# Patient Record
Sex: Female | Born: 1970 | Race: White | Hispanic: No | Marital: Married | Smoking: Never smoker
Health system: Southern US, Community
[De-identification: ages and names within clinical notes are randomized; demographics above are authoritative.]

## PROBLEM LIST (undated history)

## (undated) ENCOUNTER — Ambulatory Visit: Discharge status: Absent without leave | Payer: 59

## (undated) ENCOUNTER — Ambulatory Visit: Admission: EM | Payer: 59 | Source: Home / Self Care

## (undated) DIAGNOSIS — R112 Nausea with vomiting, unspecified: Secondary | ICD-10-CM

## (undated) DIAGNOSIS — T8859XA Other complications of anesthesia, initial encounter: Secondary | ICD-10-CM

## (undated) DIAGNOSIS — I878 Other specified disorders of veins: Secondary | ICD-10-CM

## (undated) DIAGNOSIS — M79661 Pain in right lower leg: Secondary | ICD-10-CM

## (undated) DIAGNOSIS — T4145XA Adverse effect of unspecified anesthetic, initial encounter: Secondary | ICD-10-CM

## (undated) DIAGNOSIS — IMO0001 Reserved for inherently not codable concepts without codable children: Secondary | ICD-10-CM

## (undated) DIAGNOSIS — M25561 Pain in right knee: Secondary | ICD-10-CM

## (undated) DIAGNOSIS — K219 Gastro-esophageal reflux disease without esophagitis: Secondary | ICD-10-CM

## (undated) DIAGNOSIS — W19XXXA Unspecified fall, initial encounter: Secondary | ICD-10-CM

## (undated) DIAGNOSIS — K589 Irritable bowel syndrome without diarrhea: Secondary | ICD-10-CM

## (undated) DIAGNOSIS — Z9889 Other specified postprocedural states: Secondary | ICD-10-CM

## (undated) DIAGNOSIS — I10 Essential (primary) hypertension: Secondary | ICD-10-CM

## (undated) DIAGNOSIS — M7989 Other specified soft tissue disorders: Secondary | ICD-10-CM

## (undated) DIAGNOSIS — E785 Hyperlipidemia, unspecified: Secondary | ICD-10-CM

## (undated) DIAGNOSIS — K529 Noninfective gastroenteritis and colitis, unspecified: Secondary | ICD-10-CM

## (undated) DIAGNOSIS — I499 Cardiac arrhythmia, unspecified: Secondary | ICD-10-CM

## (undated) HISTORY — DX: Essential (primary) hypertension: I10

## (undated) HISTORY — DX: Gastro-esophageal reflux disease without esophagitis: K21.9

## (undated) HISTORY — PX: ABDOMINAL HYSTERECTOMY: SHX81

## (undated) HISTORY — DX: Other specified disorders of veins: I87.8

## (undated) HISTORY — DX: Hyperlipidemia, unspecified: E78.5

## (undated) HISTORY — PX: DIAGNOSTIC LAPAROSCOPY: SUR761

## (undated) HISTORY — DX: Irritable bowel syndrome, unspecified: K58.9

## (undated) HISTORY — PX: FEMUR FRACTURE SURGERY: SHX633

## (undated) HISTORY — PX: DILATION AND CURETTAGE OF UTERUS: SHX78

## (undated) HISTORY — PX: COLON SURGERY: SHX602

## (undated) HISTORY — PX: APPENDECTOMY: SHX54

## (undated) HISTORY — PX: OTHER SURGICAL HISTORY: SHX169

## (undated) HISTORY — DX: Noninfective gastroenteritis and colitis, unspecified: K52.9

## (undated) HISTORY — PX: CHOLECYSTECTOMY: SHX55

## (undated) HISTORY — DX: Reserved for inherently not codable concepts without codable children: IMO0001

---

## 2010-12-24 ENCOUNTER — Other Ambulatory Visit: Payer: Self-pay | Admitting: Obstetrics and Gynecology

## 2010-12-24 DIAGNOSIS — Z1231 Encounter for screening mammogram for malignant neoplasm of breast: Secondary | ICD-10-CM

## 2011-01-10 ENCOUNTER — Ambulatory Visit: Payer: Self-pay

## 2011-01-31 ENCOUNTER — Other Ambulatory Visit: Payer: Self-pay | Admitting: Obstetrics and Gynecology

## 2011-01-31 ENCOUNTER — Ambulatory Visit
Admission: RE | Admit: 2011-01-31 | Discharge: 2011-01-31 | Disposition: A | Discharge status: Home / Self Care | Payer: BC Managed Care – PPO | Source: Ambulatory Visit | Attending: Obstetrics and Gynecology | Admitting: Obstetrics and Gynecology

## 2011-01-31 DIAGNOSIS — Z1231 Encounter for screening mammogram for malignant neoplasm of breast: Secondary | ICD-10-CM

## 2011-01-31 DIAGNOSIS — R928 Other abnormal and inconclusive findings on diagnostic imaging of breast: Secondary | ICD-10-CM

## 2011-02-05 ENCOUNTER — Ambulatory Visit
Admission: RE | Admit: 2011-02-05 | Discharge: 2011-02-05 | Disposition: A | Discharge status: Home / Self Care | Payer: BC Managed Care – PPO | Source: Ambulatory Visit | Attending: Obstetrics and Gynecology | Admitting: Obstetrics and Gynecology

## 2011-02-05 DIAGNOSIS — R928 Other abnormal and inconclusive findings on diagnostic imaging of breast: Secondary | ICD-10-CM

## 2012-03-02 ENCOUNTER — Other Ambulatory Visit: Payer: Self-pay | Admitting: Obstetrics and Gynecology

## 2012-03-02 DIAGNOSIS — Z1231 Encounter for screening mammogram for malignant neoplasm of breast: Secondary | ICD-10-CM

## 2012-03-06 ENCOUNTER — Ambulatory Visit
Admission: RE | Admit: 2012-03-06 | Discharge: 2012-03-06 | Disposition: A | Discharge status: Home / Self Care | Payer: BC Managed Care – PPO | Source: Ambulatory Visit | Attending: Obstetrics and Gynecology | Admitting: Obstetrics and Gynecology

## 2012-03-06 DIAGNOSIS — Z1231 Encounter for screening mammogram for malignant neoplasm of breast: Secondary | ICD-10-CM

## 2013-02-22 ENCOUNTER — Other Ambulatory Visit: Payer: Self-pay

## 2013-02-22 DIAGNOSIS — Z1231 Encounter for screening mammogram for malignant neoplasm of breast: Secondary | ICD-10-CM

## 2013-03-15 ENCOUNTER — Encounter: Payer: Self-pay | Admitting: Family Medicine

## 2013-03-15 ENCOUNTER — Ambulatory Visit (INDEPENDENT_AMBULATORY_CARE_PROVIDER_SITE_OTHER): Payer: BC Managed Care – PPO | Admitting: Family Medicine

## 2013-03-15 VITALS — BP 152/104 | HR 70 | Wt 234.0 lb

## 2013-03-15 DIAGNOSIS — M545 Low back pain, unspecified: Secondary | ICD-10-CM

## 2013-03-15 DIAGNOSIS — M549 Dorsalgia, unspecified: Secondary | ICD-10-CM

## 2013-03-15 DIAGNOSIS — I10 Essential (primary) hypertension: Secondary | ICD-10-CM

## 2013-03-15 LAB — POCT URINALYSIS DIPSTICK: Spec Grav, UA: 1.015

## 2013-03-15 MED ORDER — ETODOLAC 400 MG PO TABS: 400.0000 mg | ORAL_TABLET | Freq: Two times a day (BID) | ORAL | Status: DC

## 2013-03-15 MED ORDER — CHLORZOXAZONE 500 MG PO TABS: 500.0000 mg | ORAL_TABLET | Freq: Three times a day (TID) | ORAL | Status: DC | PRN

## 2013-03-15 NOTE — Progress Notes (Signed)
  Subjective:    Patient ID: Stacy Russell, female    DOB: 1971-10-14, 43 y.o.   MRN: [id number]  Back Pain Chronicity: sharp shooting pain three weeks ago. worse when driving car.bad pain sharp. then left, and wentaway. tpain left knee one week ago. The current episode started in the past 7 days. The problem occurs constantly. The problem has been gradually worsening since onset. The pain is present in the lumbar spine. The quality of the pain is described as aching. The pain does not radiate. The pain is at a severity of 6/10. The pain is moderate. The pain is worse during the day. The symptoms are aggravated by bending. Stiffness is present in the morning (arms and hands). She has tried NSAIDs and ice (ice and heat. helped some. considered chiropractor.) for the symptoms. The treatment provided mild relief.   Patient also claims compliance with her hypertension medicine. Not exercising as much as she would hope. Mostly watching her salt intake. No major shortness of breath.  Several weeks ago patient had leg pain. Deep in leg. Worse with motions and with driving. She recalls no injury. No acute chest pain or shortness of breath.  No obvious urinary symptoms Review of Systems  Musculoskeletal: Positive for back pain.   no bowel pain no weight loss or weight gain. No fever no chills decent appetite no urinary symptoms ROS otherwise negative     Objective:   Physical Exam  Alert mild discomfort. Vitals reviewed. Blood pressure 134/86 on repeat. Lungs clear. Heart regular rate and rhythm. Spine nontender. Positive paraspinal tenderness. Primarily lumbar region. No true CVA tenderness. Negative straight leg raise. Right leg pulses good sensation good good range of motion no joint inflammation.  UA negative.    Assessment & Plan:  Impression #1 probable lumbar strain discussed. #2 transient right leg pain clinically improved. Etiology unclear. Discussed. #3 hypertension good control discussed. #4  reflux no longer requires medication. Plan maintain same blood pressure medicine. Diet exercise discussed in encourage. Chronic back exercises encouraged. Lodine twice a day with food and chlorzoxazone 3 times a day rationale discussed. 25 minutes spent most in discussion WSL

## 2013-03-15 NOTE — Patient Instructions (Signed)
Back Exercises Back exercises help treat and prevent back injuries. The goal of back exercises is to increase the strength of your abdominal and back muscles and the flexibility of your back. These exercises should be started when you no longer have back pain. Back exercises include:  Pelvic Tilt. Lie on your back with your knees bent. Tilt your pelvis until the lower part of your back is against the floor. Hold this position 5 to 10 sec and repeat 5 to 10 times.  Knee to Chest. Pull first 1 knee up against your chest and hold for 20 to 30 seconds, repeat this with the other knee, and then both knees. This may be done with the other leg straight or bent, whichever feels better.  Sit-Ups or Curl-Ups. Bend your knees 90 degrees. Start with tilting your pelvis, and do a partial, slow sit-up, lifting your trunk only 30 to 45 degrees off the floor. Take at least 2 to 3 seconds for each sit-up. Do not do sit-ups with your knees out straight. If partial sit-ups are difficult, simply do the above but with only tightening your abdominal muscles and holding it as directed.  Hip-Lift. Lie on your back with your knees flexed 90 degrees. Push down with your feet and shoulders as you raise your hips a couple inches off the floor; hold for 10 seconds, repeat 5 to 10 times.  Back arches. Lie on your stomach, propping yourself up on bent elbows. Slowly press on your hands, causing an arch in your low back. Repeat 3 to 5 times. Any initial stiffness and discomfort should lessen with repetition over time.  Shoulder-Lifts. Lie face down with arms beside your body. Keep hips and torso pressed to floor as you slowly lift your head and shoulders off the floor. Do not overdo your exercises, especially in the beginning. Exercises may cause you some mild back discomfort which lasts for a few minutes; however, if the pain is more severe, or lasts for more than 15 minutes, do not continue exercises until you see your caregiver.  Improvement with exercise therapy for back problems is slow.  See your caregivers for assistance with developing a proper back exercise program. Document Released: 11/07/2004 Document Revised: 12/23/2011 Document Reviewed: 08/01/2011 [employer] Patient Information 2014 [employer].  

## 2013-03-18 DIAGNOSIS — I10 Essential (primary) hypertension: Secondary | ICD-10-CM | POA: Insufficient documentation

## 2013-03-18 DIAGNOSIS — M549 Dorsalgia, unspecified: Secondary | ICD-10-CM | POA: Insufficient documentation

## 2013-03-26 ENCOUNTER — Ambulatory Visit: Payer: BC Managed Care – PPO

## 2013-06-10 ENCOUNTER — Ambulatory Visit
Admission: RE | Admit: 2013-06-10 | Discharge: 2013-06-10 | Disposition: A | Discharge status: Home / Self Care | Payer: BC Managed Care – PPO | Source: Ambulatory Visit

## 2013-06-10 DIAGNOSIS — Z1231 Encounter for screening mammogram for malignant neoplasm of breast: Secondary | ICD-10-CM

## 2013-06-21 ENCOUNTER — Telehealth: Payer: Self-pay | Admitting: Family Medicine

## 2013-06-21 MED ORDER — DICLOFENAC SODIUM 75 MG PO TBEC: 75.0000 mg | DELAYED_RELEASE_TABLET | Freq: Two times a day (BID) | ORAL | Status: DC

## 2013-06-21 NOTE — Telephone Encounter (Signed)
(  Numb 28 one ref)

## 2013-06-21 NOTE — Telephone Encounter (Signed)
RX sent into [facility]. Patient was notified.

## 2013-06-21 NOTE — Telephone Encounter (Signed)
voltaren 75 bid with food

## 2013-06-21 NOTE — Telephone Encounter (Signed)
Patient would like an anti-inflammatory called in for her back pain  [address]

## 2013-06-30 ENCOUNTER — Telehealth: Payer: Self-pay | Admitting: Family Medicine

## 2013-06-30 ENCOUNTER — Encounter: Payer: Self-pay | Admitting: Family Medicine

## 2013-06-30 ENCOUNTER — Ambulatory Visit (INDEPENDENT_AMBULATORY_CARE_PROVIDER_SITE_OTHER): Payer: BC Managed Care – PPO | Admitting: Family Medicine

## 2013-06-30 VITALS — BP 140/98 | Ht 70.0 in | Wt 248.2 lb

## 2013-06-30 DIAGNOSIS — I10 Essential (primary) hypertension: Secondary | ICD-10-CM

## 2013-06-30 DIAGNOSIS — M1990 Unspecified osteoarthritis, unspecified site: Secondary | ICD-10-CM

## 2013-06-30 DIAGNOSIS — M129 Arthropathy, unspecified: Secondary | ICD-10-CM

## 2013-06-30 DIAGNOSIS — Z79899 Other long term (current) drug therapy: Secondary | ICD-10-CM

## 2013-06-30 DIAGNOSIS — R5381 Other malaise: Secondary | ICD-10-CM

## 2013-06-30 MED ORDER — CHLORZOXAZONE 500 MG PO TABS: 500.0000 mg | ORAL_TABLET | Freq: Three times a day (TID) | ORAL | Status: DC | PRN

## 2013-06-30 MED ORDER — ETODOLAC 400 MG PO TABS: 400.0000 mg | ORAL_TABLET | Freq: Two times a day (BID) | ORAL | Status: DC

## 2013-06-30 NOTE — Telephone Encounter (Signed)
etodolac (LODINE) 400 MG tablet is what the patient is wanting for her back not the Voltaren 75 mg that was called in she doesn't ever remember taking voltaren before. Pt has made an appt for this afternoon, advised this med may not be called in before her appt. Pt understands.

## 2013-06-30 NOTE — Progress Notes (Signed)
  Subjective:    Patient ID: Stacy Russell, female    DOB: 1971-03-20, 42 y.o.   MRN: [id number]  Back Pain This is a recurrent problem. The current episode started yesterday. The problem occurs constantly. The problem has been gradually worsening since onset. The pain is present in the lumbar spine. The quality of the pain is described as aching. The pain is at a severity of 9/10. The pain is severe. The pain is the same all the time. Stiffness is present all day. She has tried NSAIDs for the symptoms. The treatment provided no relief.   4 wks ago pain in both hips  exercssing on the t-mill. Three times per wk.  Hurts and aches experiencing most of the time  Went to see chiropractor  R greater than l hip  Hip and right ankle xray,  Hx of chronic low back pain, excrutiating pain yesterday. chiro adjusted and helped, chiro wants labs to r o some stuff. Patient worried about the possibility of rheumatoid arthritis or other serious type of internal inflammation.   Scan revealed of foot need for potential orthotics Intense pain when she went put down purse, Sudden terrible pain yesterday  Review of Systems  Musculoskeletal: Positive for back pain.       Objective:   Physical Exam  Alert anxious appearing lungs clear. Heart regular rate rhythm. Blood pressure 140 her 90 joints no obvious inflammatory changes low back tender to palpation. Particularly deep left lumbar region negative straight leg raise bilateral. Negative sciatic notch tenderness. Strength reflexes intact.      Assessment & Plan:  Impression 1 hypertension stable. #2 chronic back pain. #3 acute lumbar strain discussed at length. Highly doubt sciatica element at this time #4 concerns regarding serious types of arthritis discuss. Plan Lodine 400 twice a day with food when necessary. Appropriate blood work both from screening and assessment from joint inflammation and pain. Regular exercise strongly encourage. Further  recommendations based on blood results. Encouraged not to get an MRI at this time. WSL

## 2013-07-01 ENCOUNTER — Telehealth: Payer: Self-pay | Admitting: Family Medicine

## 2013-07-01 NOTE — Telephone Encounter (Signed)
error 

## 2013-07-05 LAB — HEPATIC FUNCTION PANEL
Bilirubin, Direct: 0.1 mg/dL (ref 0.0–0.3)
Indirect Bilirubin: 0.3 mg/dL (ref 0.0–0.9)
Total Bilirubin: 0.4 mg/dL (ref 0.3–1.2)

## 2013-07-05 LAB — LIPID PANEL
Cholesterol: 227 mg/dL — ABNORMAL HIGH (ref 0–200)
LDL Cholesterol: 146 mg/dL — ABNORMAL HIGH (ref 0–99)
Total CHOL/HDL Ratio: 4.8 Ratio
Triglycerides: 168 mg/dL — ABNORMAL HIGH (ref <150)
VLDL: 34 mg/dL (ref 0–40)

## 2013-07-05 LAB — SEDIMENTATION RATE: Sed Rate: 8 mm/hr (ref 0–22)

## 2013-07-05 LAB — BASIC METABOLIC PANEL
BUN: 13 mg/dL (ref 6–23)
Creat: 0.67 mg/dL (ref 0.50–1.10)
Potassium: 4 mEq/L (ref 3.5–5.3)

## 2013-07-06 ENCOUNTER — Encounter: Payer: Self-pay | Admitting: Family Medicine

## 2013-08-10 ENCOUNTER — Other Ambulatory Visit: Payer: Self-pay | Admitting: Family Medicine

## 2013-11-12 ENCOUNTER — Other Ambulatory Visit: Payer: Self-pay | Admitting: *Deleted

## 2013-11-12 ENCOUNTER — Telehealth: Payer: Self-pay | Admitting: Family Medicine

## 2013-11-12 MED ORDER — TRAMADOL HCL 50 MG PO TABS: ORAL_TABLET | ORAL | Status: DC

## 2013-11-12 MED ORDER — HYDROCODONE-ACETAMINOPHEN 5-325 MG PO TABS: ORAL_TABLET | ORAL | Status: DC

## 2013-11-12 NOTE — Telephone Encounter (Signed)
Discussed with patient. Med faxed to [facility].  

## 2013-11-12 NOTE — Telephone Encounter (Signed)
Tramadol 50 numb 20 one q 4 to 6 prn pain

## 2013-11-12 NOTE — Telephone Encounter (Signed)
Patient has history of back pain. She said she has been prescribed muscle relaxer's and anti-inflammatories. She said she pulled her back out yesterday and has not got any relief with any of her medication and its causing her to lose sleep. She is hoping that she can have something for pain called in ASAP.   [facility]

## 2013-11-12 NOTE — Telephone Encounter (Signed)
Pt states she cannot take hydrocodne because it makes  her itch. Can something else be prescribed

## 2013-11-12 NOTE — Telephone Encounter (Signed)
CAN rX HYDROCOD ACET 5/325 NUMB FIFTEEN. ONE EVERY 4 TO 6 HROS PRN PAIN DROWSY PREC. Let pt know if pain persists will need ov before further prescriptions are written

## 2014-01-14 ENCOUNTER — Encounter: Payer: Self-pay | Admitting: Family Medicine

## 2014-01-14 ENCOUNTER — Ambulatory Visit (INDEPENDENT_AMBULATORY_CARE_PROVIDER_SITE_OTHER): Payer: BC Managed Care – PPO | Admitting: Family Medicine

## 2014-01-14 VITALS — BP 130/86 | Temp 98.4°F | Ht 70.0 in | Wt 249.0 lb

## 2014-01-14 DIAGNOSIS — J310 Chronic rhinitis: Secondary | ICD-10-CM

## 2014-01-14 DIAGNOSIS — M549 Dorsalgia, unspecified: Secondary | ICD-10-CM

## 2014-01-14 DIAGNOSIS — J329 Chronic sinusitis, unspecified: Secondary | ICD-10-CM

## 2014-01-14 DIAGNOSIS — I10 Essential (primary) hypertension: Secondary | ICD-10-CM

## 2014-01-14 MED ORDER — ETODOLAC 400 MG PO TABS: 400.0000 mg | ORAL_TABLET | Freq: Two times a day (BID) | ORAL | Status: DC

## 2014-01-14 MED ORDER — LISINOPRIL 10 MG PO TABS: ORAL_TABLET | ORAL | Status: DC

## 2014-01-14 MED ORDER — CEFDINIR 300 MG PO CAPS: 300.0000 mg | ORAL_CAPSULE | Freq: Two times a day (BID) | ORAL | Status: DC

## 2014-01-14 MED ORDER — CHLORZOXAZONE 500 MG PO TABS: ORAL_TABLET | ORAL | Status: DC

## 2014-01-14 NOTE — Progress Notes (Signed)
   Subjective:    Patient ID: Stacy Russell, female    DOB: 1971-10-07, 43 y.o.   MRN: [id number]  Sinusitis This is a new problem. The current episode started in the past 7 days. The problem is unchanged. There has been no fever. The pain is moderate. Associated symptoms include congestion, coughing, headaches and sinus pressure. Past treatments include oral decongestants. The treatment provided no relief.  Patient has no other concerns at this time.   Bad headache and aching and sore  Started this weekend  Hit initially  bilat ear presure  Some clear disch yell at times  [facility] as afar as allergies  BP numbers uncertain. Claims compliance with blood pressure medicine. Tries not to Miss doses. Starting an exercise weight loss program soon. Has cut salt out of her diet.  Intermittent strain and spasm of the neck. In the past chlorzoxazone and in Lodine has helped. Patient requests a refill.  Review of Systems  HENT: Positive for congestion and sinus pressure.   Respiratory: Positive for cough.   Neurological: Positive for headaches.   No vomiting no diarrhea some probable fever no rash ROS otherwise negative    Objective:   Physical Exam  Alert blood pressure good 13 0/86 HEENT moderate his congestion frontal tenderness. Normal neck supple. Lungs clear. Heart regular rate and rhythm. No current neck or upper back spasm palpable      Assessment & Plan:  Impression 1 rhinosinusitis. #2 hypertension good control. #3 recurrent neck pain also uses chiropractor plan an inflammatory spasmus refilled. Local measures discussed. Antibiotics prescribed. Blood pressure meds refilled. Diet exercise discussed. Check every 6 months. WSL

## 2014-02-07 ENCOUNTER — Encounter (HOSPITAL_COMMUNITY): Payer: Self-pay | Admitting: Emergency Medicine

## 2014-02-07 ENCOUNTER — Encounter (HOSPITAL_COMMUNITY)
Admission: EM | Disposition: A | Discharge status: Home Health | Payer: Self-pay | Source: Home / Self Care | Attending: Orthopedic Surgery

## 2014-02-07 ENCOUNTER — Encounter (HOSPITAL_COMMUNITY): Payer: BC Managed Care – PPO | Admitting: Anesthesiology

## 2014-02-07 ENCOUNTER — Emergency Department (HOSPITAL_COMMUNITY): Payer: BC Managed Care – PPO

## 2014-02-07 ENCOUNTER — Inpatient Hospital Stay (HOSPITAL_COMMUNITY)
Admission: EM | Admit: 2014-02-07 | Discharge: 2014-02-11 | DRG: 494 | Disposition: A | Discharge status: Home Health | Payer: BC Managed Care – PPO | Attending: Orthopedic Surgery | Admitting: Orthopedic Surgery

## 2014-02-07 ENCOUNTER — Inpatient Hospital Stay (HOSPITAL_COMMUNITY): Payer: BC Managed Care – PPO | Admitting: Anesthesiology

## 2014-02-07 DIAGNOSIS — S82201B Unspecified fracture of shaft of right tibia, initial encounter for open fracture type I or II: Secondary | ICD-10-CM | POA: Diagnosis present

## 2014-02-07 DIAGNOSIS — S93402A Sprain of unspecified ligament of left ankle, initial encounter: Secondary | ICD-10-CM

## 2014-02-07 DIAGNOSIS — W108XXA Fall (on) (from) other stairs and steps, initial encounter: Secondary | ICD-10-CM | POA: Diagnosis present

## 2014-02-07 DIAGNOSIS — S82302B Unspecified fracture of lower end of left tibia, initial encounter for open fracture type I or II: Secondary | ICD-10-CM | POA: Diagnosis present

## 2014-02-07 DIAGNOSIS — S93409A Sprain of unspecified ligament of unspecified ankle, initial encounter: Secondary | ICD-10-CM | POA: Diagnosis present

## 2014-02-07 DIAGNOSIS — S82209B Unspecified fracture of shaft of unspecified tibia, initial encounter for open fracture type I or II: Principal | ICD-10-CM | POA: Diagnosis present

## 2014-02-07 DIAGNOSIS — S82409B Unspecified fracture of shaft of unspecified fibula, initial encounter for open fracture type I or II: Principal | ICD-10-CM

## 2014-02-07 DIAGNOSIS — I10 Essential (primary) hypertension: Secondary | ICD-10-CM | POA: Diagnosis present

## 2014-02-07 DIAGNOSIS — E785 Hyperlipidemia, unspecified: Secondary | ICD-10-CM | POA: Diagnosis present

## 2014-02-07 DIAGNOSIS — S82401B Unspecified fracture of shaft of right fibula, initial encounter for open fracture type I or II: Secondary | ICD-10-CM

## 2014-02-07 DIAGNOSIS — K219 Gastro-esophageal reflux disease without esophagitis: Secondary | ICD-10-CM | POA: Diagnosis present

## 2014-02-07 DIAGNOSIS — Y92009 Unspecified place in unspecified non-institutional (private) residence as the place of occurrence of the external cause: Secondary | ICD-10-CM

## 2014-02-07 HISTORY — PX: INCISION AND DRAINAGE OF WOUND: SHX1803

## 2014-02-07 HISTORY — PX: TIBIA IM NAIL INSERTION: SHX2516

## 2014-02-07 LAB — CBC WITH DIFFERENTIAL/PLATELET
BASOS ABS: 0 10*3/uL (ref 0.0–0.1)
BASOS PCT: 0 % (ref 0–1)
EOS ABS: 0.1 10*3/uL (ref 0.0–0.7)
Eosinophils Relative: 1 % (ref 0–5)
HCT: 42.3 % (ref 36.0–46.0)
HEMOGLOBIN: 14.4 g/dL (ref 12.0–15.0)
Lymphocytes Relative: 16 % (ref 12–46)
Lymphs Abs: 1.7 10*3/uL (ref 0.7–4.0)
MCH: 30 pg (ref 26.0–34.0)
MCHC: 34 g/dL (ref 30.0–36.0)
MCV: 88.1 fL (ref 78.0–100.0)
Monocytes Absolute: 0.4 10*3/uL (ref 0.1–1.0)
Monocytes Relative: 4 % (ref 3–12)
NEUTROS ABS: 8.3 10*3/uL — AB (ref 1.7–7.7)
NEUTROS PCT: 79 % — AB (ref 43–77)
PLATELETS: 273 10*3/uL (ref 150–400)
RBC: 4.8 MIL/uL (ref 3.87–5.11)
RDW: 12.5 % (ref 11.5–15.5)
WBC: 10.5 10*3/uL (ref 4.0–10.5)

## 2014-02-07 LAB — BASIC METABOLIC PANEL
BUN: 16 mg/dL (ref 6–23)
CHLORIDE: 98 meq/L (ref 96–112)
CO2: 24 mEq/L (ref 19–32)
Calcium: 9.2 mg/dL (ref 8.4–10.5)
Creatinine, Ser: 0.64 mg/dL (ref 0.50–1.10)
Glucose, Bld: 113 mg/dL — ABNORMAL HIGH (ref 70–99)
POTASSIUM: 3.9 meq/L (ref 3.7–5.3)
SODIUM: 136 meq/L — AB (ref 137–147)

## 2014-02-07 SURGERY — INSERTION, INTRAMEDULLARY ROD, TIBIA
Anesthesia: General | Site: Leg Lower | Laterality: Right

## 2014-02-07 MED ORDER — MIDAZOLAM HCL 2 MG/2ML IJ SOLN
INTRAMUSCULAR | Status: AC
  Filled 2014-02-07: qty 2

## 2014-02-07 MED ORDER — ONDANSETRON HCL 4 MG/2ML IJ SOLN
INTRAMUSCULAR | Status: DC | PRN
  Administered 2014-02-07: 4 mg via INTRAVENOUS

## 2014-02-07 MED ORDER — FENTANYL CITRATE 0.05 MG/ML IJ SOLN
100.0000 ug | INTRAMUSCULAR | Status: DC | PRN
  Administered 2014-02-07 – 2014-02-08 (×6): 100 ug via INTRAVENOUS
  Filled 2014-02-07 (×6): qty 2

## 2014-02-07 MED ORDER — TETANUS-DIPHTH-ACELL PERTUSSIS 5-2.5-18.5 LF-MCG/0.5 IM SUSP
0.5000 mL | Freq: Once | INTRAMUSCULAR | Status: AC
  Administered 2014-02-07: 0.5 mL via INTRAMUSCULAR
  Filled 2014-02-07: qty 0.5

## 2014-02-07 MED ORDER — FENTANYL CITRATE 0.05 MG/ML IJ SOLN
INTRAMUSCULAR | Status: DC | PRN
  Administered 2014-02-07 (×5): 100 ug via INTRAVENOUS
  Administered 2014-02-07 – 2014-02-08 (×3): 50 ug via INTRAVENOUS
  Administered 2014-02-08: 100 ug via INTRAVENOUS

## 2014-02-07 MED ORDER — FENTANYL CITRATE 0.05 MG/ML IJ SOLN
100.0000 ug | Freq: Once | INTRAMUSCULAR | Status: AC
  Administered 2014-02-07: 100 ug via INTRAVENOUS
  Filled 2014-02-07: qty 2

## 2014-02-07 MED ORDER — PROPOFOL 10 MG/ML IV BOLUS
INTRAVENOUS | Status: AC
  Filled 2014-02-07: qty 20

## 2014-02-07 MED ORDER — GLYCOPYRROLATE 0.2 MG/ML IJ SOLN
INTRAMUSCULAR | Status: AC
  Filled 2014-02-07: qty 2

## 2014-02-07 MED ORDER — SODIUM CHLORIDE 0.9 % IV BOLUS (SEPSIS)
1000.0000 mL | Freq: Once | INTRAVENOUS | Status: AC
  Administered 2014-02-07: 1000 mL via INTRAVENOUS

## 2014-02-07 MED ORDER — SODIUM CHLORIDE 0.9 % IV SOLN
INTRAVENOUS | Status: DC | PRN
  Administered 2014-02-07: 20:00:00 via INTRAVENOUS

## 2014-02-07 MED ORDER — FENTANYL CITRATE 0.05 MG/ML IJ SOLN
INTRAMUSCULAR | Status: AC
  Filled 2014-02-07: qty 5

## 2014-02-07 MED ORDER — SCOPOLAMINE 1 MG/3DAYS TD PT72
MEDICATED_PATCH | TRANSDERMAL | Status: DC | PRN
  Administered 2014-02-07: 1 via TRANSDERMAL

## 2014-02-07 MED ORDER — SUCCINYLCHOLINE CHLORIDE 20 MG/ML IJ SOLN
INTRAMUSCULAR | Status: AC
  Filled 2014-02-07: qty 1

## 2014-02-07 MED ORDER — MIDAZOLAM HCL 5 MG/5ML IJ SOLN
INTRAMUSCULAR | Status: DC | PRN
  Administered 2014-02-07: 2 mg via INTRAVENOUS

## 2014-02-07 MED ORDER — CEFAZOLIN SODIUM-DEXTROSE 2-3 GM-% IV SOLR
INTRAVENOUS | Status: AC
  Filled 2014-02-07: qty 50

## 2014-02-07 MED ORDER — ONDANSETRON HCL 4 MG/2ML IJ SOLN
INTRAMUSCULAR | Status: AC
  Filled 2014-02-07: qty 2

## 2014-02-07 MED ORDER — SCOPOLAMINE 1 MG/3DAYS TD PT72
MEDICATED_PATCH | TRANSDERMAL | Status: AC
Start: 2014-02-07 — End: 2014-02-08
  Filled 2014-02-07: qty 1

## 2014-02-07 MED ORDER — DIPHENHYDRAMINE HCL 50 MG/ML IJ SOLN
INTRAMUSCULAR | Status: AC
  Filled 2014-02-07: qty 1

## 2014-02-07 MED ORDER — NEOSTIGMINE METHYLSULFATE 1 MG/ML IJ SOLN
INTRAMUSCULAR | Status: AC
  Filled 2014-02-07: qty 10

## 2014-02-07 MED ORDER — ONDANSETRON HCL 4 MG/2ML IJ SOLN
4.0000 mg | Freq: Four times a day (QID) | INTRAMUSCULAR | Status: DC
  Administered 2014-02-07 – 2014-02-08 (×5): 4 mg via INTRAVENOUS
  Filled 2014-02-07 (×5): qty 2

## 2014-02-07 MED ORDER — LACTATED RINGERS IV SOLN
INTRAVENOUS | Status: DC | PRN
  Administered 2014-02-07 – 2014-02-08 (×3): via INTRAVENOUS

## 2014-02-07 MED ORDER — SUCCINYLCHOLINE CHLORIDE 20 MG/ML IJ SOLN
INTRAMUSCULAR | Status: DC | PRN
Start: 2014-02-07 — End: 2014-02-08
  Administered 2014-02-07: 120 mg via INTRAVENOUS

## 2014-02-07 MED ORDER — ONDANSETRON HCL 4 MG/2ML IJ SOLN
4.0000 mg | Freq: Once | INTRAMUSCULAR | Status: AC
  Administered 2014-02-07: 4 mg via INTRAVENOUS
  Filled 2014-02-07: qty 2

## 2014-02-07 MED ORDER — ONDANSETRON HCL 4 MG PO TABS
4.0000 mg | ORAL_TABLET | Freq: Four times a day (QID) | ORAL | Status: DC | PRN
  Administered 2014-02-11 (×2): 4 mg via ORAL
  Filled 2014-02-07: qty 1

## 2014-02-07 MED ORDER — DIPHENHYDRAMINE HCL 50 MG/ML IJ SOLN
INTRAMUSCULAR | Status: DC | PRN
  Administered 2014-02-07 (×2): 12.5 mg via INTRAVENOUS

## 2014-02-07 MED ORDER — SODIUM CHLORIDE 0.9 % IR SOLN
Status: DC | PRN
  Administered 2014-02-07: 1000 mL

## 2014-02-07 MED ORDER — OXYCODONE HCL 5 MG/5ML PO SOLN: 5.0000 mg | Freq: Once | ORAL | Status: AC | PRN

## 2014-02-07 MED ORDER — CEFAZOLIN SODIUM-DEXTROSE 2-3 GM-% IV SOLR
2.0000 g | Freq: Once | INTRAVENOUS | Status: AC
  Administered 2014-02-07: 2 g via INTRAVENOUS
  Filled 2014-02-07: qty 50

## 2014-02-07 MED ORDER — LIDOCAINE HCL (CARDIAC) 20 MG/ML IV SOLN
INTRAVENOUS | Status: DC | PRN
  Administered 2014-02-07: 100 mg via INTRAVENOUS

## 2014-02-07 MED ORDER — CEFAZOLIN SODIUM-DEXTROSE 2-3 GM-% IV SOLR
INTRAVENOUS | Status: DC | PRN
  Administered 2014-02-07: 2 g via INTRAVENOUS
  Administered 2014-02-08: 1 g via INTRAVENOUS

## 2014-02-07 MED ORDER — ROCURONIUM BROMIDE 50 MG/5ML IV SOLN
INTRAVENOUS | Status: AC
  Filled 2014-02-07: qty 1

## 2014-02-07 MED ORDER — MORPHINE SULFATE 2 MG/ML IJ SOLN
1.0000 mg | INTRAMUSCULAR | Status: DC | PRN
  Administered 2014-02-08: 2 mg via INTRAVENOUS

## 2014-02-07 MED ORDER — PROPOFOL 10 MG/ML IV BOLUS
INTRAVENOUS | Status: DC | PRN
  Administered 2014-02-07: 200 mg via INTRAVENOUS

## 2014-02-07 MED ORDER — OXYCODONE HCL 5 MG PO TABS: 5.0000 mg | ORAL_TABLET | Freq: Once | ORAL | Status: AC | PRN

## 2014-02-07 MED ORDER — LIDOCAINE HCL (CARDIAC) 20 MG/ML IV SOLN
INTRAVENOUS | Status: AC
  Filled 2014-02-07: qty 5

## 2014-02-07 SURGICAL SUPPLY — 86 items
BANDAGE ELASTIC 4 VELCRO ST LF (GAUZE/BANDAGES/DRESSINGS) ×3 IMPLANT
BANDAGE ELASTIC 6 VELCRO ST LF (GAUZE/BANDAGES/DRESSINGS) ×3 IMPLANT
BANDAGE ESMARK 6X9 LF (GAUZE/BANDAGES/DRESSINGS) ×2 IMPLANT
BANDAGE GAUZE ELAST BULKY 4 IN (GAUZE/BANDAGES/DRESSINGS) ×3 IMPLANT
BIT DRILL CALIBRATED 4.3X320MM (BIT) ×2 IMPLANT
BIT DRILL CROWE POINT TWST 4.3 (DRILL) ×2 IMPLANT
BLADE 10 SAFETY STRL DISP (BLADE) ×6 IMPLANT
BNDG COHESIVE 4X5 TAN STRL (GAUZE/BANDAGES/DRESSINGS) ×3 IMPLANT
BNDG ESMARK 6X9 LF (GAUZE/BANDAGES/DRESSINGS) ×3
COVER SURGICAL LIGHT HANDLE (MISCELLANEOUS) ×3 IMPLANT
CUFF TOURNIQUET SINGLE 18IN (TOURNIQUET CUFF) IMPLANT
CUFF TOURNIQUET SINGLE 24IN (TOURNIQUET CUFF) IMPLANT
CUFF TOURNIQUET SINGLE 34IN LL (TOURNIQUET CUFF) ×3 IMPLANT
CUFF TOURNIQUET SINGLE 44IN (TOURNIQUET CUFF) IMPLANT
DERMABOND ADVANCED (GAUZE/BANDAGES/DRESSINGS) ×1
DERMABOND ADVANCED .7 DNX12 (GAUZE/BANDAGES/DRESSINGS) ×2 IMPLANT
DRAPE C-ARM 42X72 X-RAY (DRAPES) ×3 IMPLANT
DRAPE C-ARMOR (DRAPES) ×3 IMPLANT
DRAPE INCISE IOBAN 66X45 STRL (DRAPES) ×3 IMPLANT
DRAPE OEC MINIVIEW 54X84 (DRAPES) IMPLANT
DRAPE ORTHO SPLIT 77X108 STRL (DRAPES) ×2
DRAPE SURG ORHT 6 SPLT 77X108 (DRAPES) ×4 IMPLANT
DRAPE U-SHAPE 47X51 STRL (DRAPES) ×6 IMPLANT
DRILL CALIBRATED 4.3X320MM (BIT) ×3
DRILL CROWE POINT TWIST 4.3 (DRILL) ×3
DRSG ADAPTIC 3X8 NADH LF (GAUZE/BANDAGES/DRESSINGS) ×3 IMPLANT
DRSG MEPILEX BORDER 4X4 (GAUZE/BANDAGES/DRESSINGS) ×3 IMPLANT
DRSG MEPILEX BORDER 4X8 (GAUZE/BANDAGES/DRESSINGS) ×3 IMPLANT
DURAPREP 26ML APPLICATOR (WOUND CARE) ×3 IMPLANT
ELECT REM PT RETURN 9FT ADLT (ELECTROSURGICAL) ×6
ELECTRODE REM PT RTRN 9FT ADLT (ELECTROSURGICAL) ×4 IMPLANT
EVACUATOR 1/8 PVC DRAIN (DRAIN) IMPLANT
GAUZE XEROFORM 5X9 LF (GAUZE/BANDAGES/DRESSINGS) ×3 IMPLANT
GLOVE BIOGEL PI IND STRL 7.5 (GLOVE) ×6 IMPLANT
GLOVE BIOGEL PI IND STRL 8 (GLOVE) ×2 IMPLANT
GLOVE BIOGEL PI INDICATOR 7.5 (GLOVE) ×3
GLOVE BIOGEL PI INDICATOR 8 (GLOVE) ×1
GLOVE ECLIPSE 8.0 STRL XLNG CF (GLOVE) ×3 IMPLANT
GLOVE ORTHO TXT STRL SZ7.5 (GLOVE) ×9 IMPLANT
GLOVE SKINSENSE NS SZ8.0 LF (GLOVE)
GLOVE SKINSENSE STRL SZ8.0 LF (GLOVE) IMPLANT
GLOVE SURG ORTHO 8.0 STRL STRW (GLOVE) ×6 IMPLANT
GOWN STRL REIN 3XL XLG LVL4 (GOWN DISPOSABLE) ×3 IMPLANT
GOWN STRL REUS W/ TWL LRG LVL3 (GOWN DISPOSABLE) ×12 IMPLANT
GOWN STRL REUS W/TWL LRG LVL3 (GOWN DISPOSABLE) ×6
GUIDEWIRE BEAD TIP (WIRE) ×3 IMPLANT
HANDPIECE INTERPULSE COAX TIP (DISPOSABLE) ×1
KIT BASIN OR (CUSTOM PROCEDURE TRAY) ×3 IMPLANT
KIT ROOM TURNOVER OR (KITS) ×6 IMPLANT
MANIFOLD NEPTUNE II (INSTRUMENTS) IMPLANT
NS IRRIG 1000ML POUR BTL (IV SOLUTION) ×3 IMPLANT
PACK ORTHO EXTREMITY (CUSTOM PROCEDURE TRAY) ×3 IMPLANT
PAD ABD 8X10 STRL (GAUZE/BANDAGES/DRESSINGS) ×3 IMPLANT
PAD ARMBOARD 7.5X6 YLW CONV (MISCELLANEOUS) ×6 IMPLANT
PAD CAST 4YDX4 CTTN HI CHSV (CAST SUPPLIES) ×4 IMPLANT
PADDING CAST COTTON 4X4 STRL (CAST SUPPLIES) ×2
SCREW CORT TI DBL LEAD 5X36 (Screw) ×3 IMPLANT
SCREW CORT TI DBL LEAD 5X38 (Screw) ×3 IMPLANT
SCREW CORT TI DBL LEAD 5X42 (Screw) ×3 IMPLANT
SCREW CORT TI DBL LEAD 5X46 (Screw) ×3 IMPLANT
SCREW CORT TI DBL LEAD 5X56 (Screw) ×3 IMPLANT
SCREW RECON 2.7X18 (Screw) ×3 IMPLANT
SET HNDPC FAN SPRY TIP SCT (DISPOSABLE) ×2 IMPLANT
SPLINT PLASTER CAST XFAST 5X30 (CAST SUPPLIES) ×2 IMPLANT
SPLINT PLASTER XFAST SET 5X30 (CAST SUPPLIES) ×1
SPONGE GAUZE 4X4 12PLY (GAUZE/BANDAGES/DRESSINGS) ×6 IMPLANT
SPONGE LAP 18X18 X RAY DECT (DISPOSABLE) ×9 IMPLANT
STAPLER VISISTAT 35W (STAPLE) ×3 IMPLANT
STOCKINETTE TUBULAR 6 INCH (GAUZE/BANDAGES/DRESSINGS) ×3 IMPLANT
SUCTION FRAZIER TIP 10 FR DISP (SUCTIONS) ×3 IMPLANT
SUT ETHILON 2 0 FS 18 (SUTURE) ×3 IMPLANT
SUT MNCRL AB 4-0 PS2 18 (SUTURE) ×3 IMPLANT
SUT PROLENE 3 0 PS 2 (SUTURE) IMPLANT
SUT STEEL 7 (SUTURE) ×6 IMPLANT
SUT VIC AB 0 CT1 27 (SUTURE) ×1
SUT VIC AB 0 CT1 27XBRD ANBCTR (SUTURE) ×2 IMPLANT
SUT VIC AB 1 CT1 27 (SUTURE) ×2
SUT VIC AB 1 CT1 27XBRD ANBCTR (SUTURE) ×4 IMPLANT
SUT VIC AB 2-0 CT1 27 (SUTURE) ×2
SUT VIC AB 2-0 CT1 TAPERPNT 27 (SUTURE) ×4 IMPLANT
TOWEL OR 17X24 6PK STRL BLUE (TOWEL DISPOSABLE) ×6 IMPLANT
TOWEL OR 17X26 10 PK STRL BLUE (TOWEL DISPOSABLE) ×6 IMPLANT
TRAY CATH 16FR W/PLASTIC CATH (SET/KITS/TRAYS/PACK) ×3 IMPLANT
TUBE CONNECTING 12X1/4 (SUCTIONS) ×3 IMPLANT
WATER STERILE IRR 1000ML POUR (IV SOLUTION) ×3 IMPLANT
YANKAUER SUCT BULB TIP NO VENT (SUCTIONS) ×3 IMPLANT

## 2014-02-07 NOTE — Anesthesia Preprocedure Evaluation (Addendum)
Anesthesia Evaluation  Patient identified by MRN, date of birth, ID band Patient awake    Reviewed: Allergy & Precautions, H&P , NPO status , Patient's Chart, lab work & pertinent test results  History of Anesthesia Complications Negative for: history of anesthetic complications  Airway Mallampati: III TM Distance: >3 FB Neck ROM: Full    Dental no notable dental hx. (+) Teeth Intact, Dental Advisory Given   Pulmonary neg pulmonary ROS,  breath sounds clear to auscultation  Pulmonary exam normal       Cardiovascular hypertension, On Medications negative cardio ROS  Rhythm:Regular Rate:Normal     Neuro/Psych negative neurological ROS  negative psych ROS   GI/Hepatic negative GI ROS, Neg liver ROS,   Endo/Other  negative endocrine ROS  Renal/GU negative Renal ROS  negative genitourinary   Musculoskeletal   Abdominal   Peds  Hematology negative hematology ROS (+)   Anesthesia Other Findings   Reproductive/Obstetrics negative OB ROS                        Anesthesia Physical Anesthesia Plan  ASA: II  Anesthesia Plan: General   Post-op Pain Management:    Induction: Intravenous, Rapid sequence and Cricoid pressure planned  Airway Management Planned: Oral ETT  Additional Equipment:   Intra-op Plan:   Post-operative Plan: Extubation in OR  Informed Consent: I have reviewed the patients History and Physical, chart, labs and discussed the procedure including the risks, benefits and alternatives for the proposed anesthesia with the patient or authorized representative who has indicated his/her understanding and acceptance.   Dental advisory given  Plan Discussed with: CRNA  Anesthesia Plan Comments:       Anesthesia Quick Evaluation

## 2014-02-07 NOTE — Progress Notes (Signed)
Attending Paged about patient being on the floor. Had a call back and he mentioned patient should be kept on NPO for possible surgery tonight. Will continue to monitor.

## 2014-02-07 NOTE — ED Provider Notes (Signed)
CSN: [id number]     Arrival date & time 02/07/14  1145 History   First MD Initiated Contact with Patient 02/07/14 1146     No chief complaint on file.    (Consider location/radiation/quality/duration/timing/severity/associated sxs/prior Treatment) Patient is a 43 y.o. female presenting with fall and leg pain.  Fall This is a new problem. The current episode started less than 1 hour ago. Episode frequency: once. The problem has been resolved. Pertinent negatives include no chest pain, no abdominal pain, no headaches and no shortness of breath.  Leg Pain Location:  Ankle Injury: yes   Mechanism of injury: fall   Fall:    Fall occurred:  Down stairs (last step or two)   Impact surface:  Concrete Ankle location:  R ankle Pain details:    Quality:  Sharp   Severity:  Severe   Onset quality:  Sudden   Timing:  Constant   Progression:  Unchanged Tetanus status:  Out of date Relieved by:  Nothing Exacerbated by: movement. Ineffective treatments: 18mg  Morphine given PTA.   Past Medical History  Diagnosis Date  . Hypertension   . Hyperlipidemia   . Venous stasis   . Reflux   . Colitis   . IBS (irritable bowel syndrome)    Past Surgical History  Procedure Laterality Date  . Cholecystectomy    . Abdominal hysterectomy    . Colon surgery    . Femur fracture surgery    . Laproscopic removal of scar tissue     Family History  Problem Relation Age of Onset  . Hyperlipidemia Mother   . Hypertension Maternal Aunt   . Hypertension Maternal Uncle   . Hypertension Paternal Aunt   . Hypertension Paternal Uncle   . Cancer Other     breast and colon  . Heart attack Other   . Diabetes Other   . Heart attack Other    History  Substance Use Topics  . Smoking status: Never Smoker   . Smokeless tobacco: Not on file  . Alcohol Use: Not on file   OB History   Grav Para Term Preterm Abortions TAB SAB Ect Mult Living                 Review of Systems  Respiratory: Negative for  shortness of breath.   Cardiovascular: Negative for chest pain.  Gastrointestinal: Negative for abdominal pain.  Musculoskeletal: Positive for extremity weakness.  Neurological: Negative for headaches.  All other systems reviewed and are negative.     Allergies  Codeine and Dilaudid  Home Medications   Prior to Admission medications   Medication Sig Start Date End Date Taking? Authorizing Provider  aspirin 81 MG tablet Take 81 mg by mouth daily.    Historical Provider, MD  cefdinir (OMNICEF) 300 MG capsule Take 1 capsule (300 mg total) by mouth 2 (two) times daily. For 10 days 01/14/14   Mikey Kirschner, MD  chlorzoxazone (PARAFON) 500 MG tablet TAKE ONE TABLET BY MOUTH 3 TIMES DAILY FOR MUSCLE SPASM(S). 01/14/14   Mikey Kirschner, MD  etodolac (LODINE) 400 MG tablet Take 1 tablet (400 mg total) by mouth 2 (two) times daily. 01/14/14   Mikey Kirschner, MD  L-GLUTAMINE PO Take by mouth daily.    Historical Provider, MD  lisinopril (PRINIVIL,ZESTRIL) 10 MG tablet TAKE (1) TABLET BY MOUTH AT BEDTIME. 01/14/14   Mikey Kirschner, MD  Multiple Vitamin (MULTIVITAMIN) tablet Take 1 tablet by mouth daily.    Historical Provider,  MD  sertraline (ZOLOFT) 50 MG tablet Take 50 mg by mouth daily.    Historical Provider, MD  traMADol (ULTRAM) 50 MG tablet Take one tablet every 4 - 6 hours prn pain. 11/12/13   Mikey Kirschner, MD   There were no vitals taken for this visit. Physical Exam  Nursing note and vitals reviewed. Constitutional: She is oriented to person, place, and time. She appears well-developed and well-nourished. No distress.  HENT:  Head: Normocephalic and atraumatic. Head is without raccoon's eyes and without Battle's sign.  Nose: Nose normal.  Eyes: Conjunctivae and EOM are normal. Pupils are equal, round, and reactive to light. No scleral icterus.  Neck: No spinous process tenderness and no muscular tenderness present.  Cardiovascular: Normal rate, regular rhythm, normal heart  sounds and intact distal pulses.   No murmur heard. Pulmonary/Chest: Effort normal and breath sounds normal. She has no rales. She exhibits no tenderness.  Abdominal: Soft. There is no tenderness. There is no rebound and no guarding.  Musculoskeletal: Normal range of motion. She exhibits no edema.       Right ankle: She exhibits deformity and laceration. She exhibits normal pulse. Tenderness.       Left ankle: She exhibits swelling. She exhibits no laceration and normal pulse. No tenderness.  No evidence of trauma to extremities, except as noted.  2+ distal pulses.    Neurological: She is alert and oriented to person, place, and time.  Skin: Skin is warm and dry. No rash noted.  Psychiatric: She has a normal mood and affect.    ED Course  Procedures (including critical care time) Labs Review Labs Reviewed  CBC WITH DIFFERENTIAL - Abnormal; Notable for the following:    Neutrophils Relative % 79 (*)    Neutro Abs 8.3 (*)    All other components within normal limits  BASIC METABOLIC PANEL - Abnormal; Notable for the following:    Sodium 136 (*)    Glucose, Bld 113 (*)    All other components within normal limits    Imaging Review Dg Tibia/fibula Right  02/07/2014   CLINICAL DATA:  Right ankle pain  EXAM: RIGHT TIBIA AND FIBULA - 2 VIEW  COMPARISON:  None.  FINDINGS: There is a severely comminuted and mildly displaced oblique fracture of the distal tibial diaphysis with apex medial angulation. There is a comminuted fracture of the distal fibular diaphysis with apex medial angulation. There is an overlying skin wound medially consistent with an open fracture. There is no other fracture or dislocation.  IMPRESSION: Severely comminuted fractures of the distal tibial diaphysis and distal fibular diaphysis with apex medial angulation.   Electronically Signed   By: Kathreen Devoid   On: 02/07/2014 13:22   Dg Ankle Complete Left  02/07/2014   CLINICAL DATA:  Left ankle pain laterally  EXAM: LEFT  ANKLE COMPLETE - 3+ VIEW  COMPARISON:  None.  FINDINGS: There is no evidence of fracture, dislocation, or joint effusion. Small plantar calcaneal spur is noted. There is no evidence of arthropathy or other focal bone abnormality. Severe soft tissue swelling over the anteriorly and lateral malleolus.  IMPRESSION: No acute osseous injury of the left ankle.   Electronically Signed   By: Kathreen Devoid   On: 02/07/2014 13:20  All radiology studies independently viewed by me.      EKG Interpretation   Date/Time:  Monday February 07 2014 11:56:14 EDT Ventricular Rate:  94 PR Interval:  175 QRS Duration: 95 QT Interval:  361 QTC Calculation: 451 R Axis:   85 Text Interpretation:  Sinus rhythm No old tracing to compare Confirmed by  [facility]  MD, TREY ([id number]) on 02/07/2014 12:17:08 PM      MDM   Final diagnoses:  Open fracture of right fibula and tibia    43 yo female who injured right ankle when she slipped down the bottom step at her house.  Has open deformity.  Left ankle also with some swelling, but no significant tenderness.  Plain films pending.  Fentanyl given.  tdap updated.  Ancef ordered.    I discussed case with Dr. Alvan Dame, who will admit for operative management.    Houston Siren III, MD 02/07/14 [phone]

## 2014-02-07 NOTE — ED Notes (Signed)
Pt o2sat 90%ra, nasal cannula applied at 2lpm and O2sat increased to 95%

## 2014-02-07 NOTE — Anesthesia Procedure Notes (Signed)
Procedure Name: Intubation Date/Time: 02/07/2014 9:17 PM Performed by: Mosie Epstein Pre-anesthesia Checklist: Patient identified, Timeout performed, Emergency Drugs available, Suction available and Patient being monitored Patient Re-evaluated:Patient Re-evaluated prior to inductionOxygen Delivery Method: Circle system utilized Preoxygenation: Pre-oxygenation with 100% oxygen Intubation Type: IV induction, Rapid sequence and Cricoid Pressure applied Ventilation: Mask ventilation without difficulty Laryngoscope Size: Mac and 3 Grade View: Grade II Tube type: Oral Number of attempts: 1 Airway Equipment and Method: Stylet (anterior but easily visualized ) Placement Confirmation: ETT inserted through vocal cords under direct vision,  breath sounds checked- equal and bilateral and positive ETCO2 Secured at: 22 cm Tube secured with: Tape Dental Injury: Teeth and Oropharynx as per pre-operative assessment

## 2014-02-07 NOTE — Consult Note (Signed)
Open right distal tib/fib fracture  Full History and physical to be documented later  Reviewed injury with patient and her husband Plan is to go to OR tonight for I&D and ORIF of the right tibia Consent signed

## 2014-02-07 NOTE — ED Notes (Signed)
Pt's pants were cut off prior to arrival to hospital; pt gave consent to cut her underwear, pulled pt's shirt and bra off and handed to pt's husband which is at bedside; pt placed in gown, on monitor, continuous pulse oximetry and blood pressure cuff; EKG performed

## 2014-02-07 NOTE — ED Notes (Addendum)
Pt was walking down front steps of home and fell landing on buttocks. Denies hitting head, neck pain, or back pain. Pt c/o right open ankle fx and left ankle pain and swelling. PMS intact. Pt A&Ox4. Pt received a total of 18mg  of Morphine en route. Pt did have a rash around IV site after morphine and causes a couple of whelps to right lower forearm.

## 2014-02-08 ENCOUNTER — Inpatient Hospital Stay (HOSPITAL_COMMUNITY): Payer: BC Managed Care – PPO

## 2014-02-08 ENCOUNTER — Encounter (HOSPITAL_COMMUNITY): Payer: Self-pay

## 2014-02-08 DIAGNOSIS — S82302B Unspecified fracture of lower end of left tibia, initial encounter for open fracture type I or II: Secondary | ICD-10-CM | POA: Diagnosis present

## 2014-02-08 MED ORDER — CEFAZOLIN SODIUM-DEXTROSE 2-3 GM-% IV SOLR
2.0000 g | Freq: Four times a day (QID) | INTRAVENOUS | Status: AC
  Administered 2014-02-08 – 2014-02-10 (×8): 2 g via INTRAVENOUS
  Filled 2014-02-08 (×10): qty 50

## 2014-02-08 MED ORDER — POLYETHYLENE GLYCOL 3350 17 G PO PACK
17.0000 g | PACK | Freq: Two times a day (BID) | ORAL | Status: DC
  Administered 2014-02-08 – 2014-02-11 (×7): 17 g via ORAL
  Filled 2014-02-08 (×9): qty 1

## 2014-02-08 MED ORDER — METHOCARBAMOL 100 MG/ML IJ SOLN: 500.0000 mg | Freq: Four times a day (QID) | INTRAVENOUS | Status: DC | PRN

## 2014-02-08 MED ORDER — CEFAZOLIN SODIUM 1-5 GM-% IV SOLN
INTRAVENOUS | Status: AC
  Filled 2014-02-08: qty 50

## 2014-02-08 MED ORDER — SERTRALINE HCL 50 MG PO TABS
50.0000 mg | ORAL_TABLET | Freq: Every day | ORAL | Status: DC
  Administered 2014-02-08 – 2014-02-11 (×4): 50 mg via ORAL
  Filled 2014-02-08 (×4): qty 1

## 2014-02-08 MED ORDER — ZOLPIDEM TARTRATE 5 MG PO TABS
5.0000 mg | ORAL_TABLET | Freq: Every evening | ORAL | Status: DC | PRN
  Administered 2014-02-08 – 2014-02-10 (×3): 5 mg via ORAL
  Filled 2014-02-08 (×3): qty 1

## 2014-02-08 MED ORDER — FENTANYL CITRATE 0.05 MG/ML IJ SOLN
50.0000 ug | INTRAMUSCULAR | Status: DC | PRN
  Administered 2014-02-08 (×3): 50 ug via INTRAVENOUS
  Filled 2014-02-08 (×3): qty 2

## 2014-02-08 MED ORDER — DIPHENHYDRAMINE HCL 12.5 MG/5ML PO ELIX
25.0000 mg | ORAL_SOLUTION | Freq: Four times a day (QID) | ORAL | Status: DC | PRN
  Administered 2014-02-08 – 2014-02-10 (×7): 25 mg via ORAL
  Filled 2014-02-08 (×6): qty 10

## 2014-02-08 MED ORDER — CHLORZOXAZONE 500 MG PO TABS
500.0000 mg | ORAL_TABLET | Freq: Three times a day (TID) | ORAL | Status: DC | PRN
  Filled 2014-02-08: qty 1

## 2014-02-08 MED ORDER — PROMETHAZINE HCL 25 MG/ML IJ SOLN
INTRAMUSCULAR | Status: AC
  Filled 2014-02-08: qty 1

## 2014-02-08 MED ORDER — MAGNESIUM CITRATE PO SOLN: 0.5000 | Freq: Once | ORAL | Status: AC | PRN

## 2014-02-08 MED ORDER — LISINOPRIL 10 MG PO TABS
10.0000 mg | ORAL_TABLET | Freq: Every day | ORAL | Status: DC
  Administered 2014-02-08 – 2014-02-11 (×4): 10 mg via ORAL
  Filled 2014-02-08 (×4): qty 1

## 2014-02-08 MED ORDER — MORPHINE SULFATE 4 MG/ML IJ SOLN
INTRAMUSCULAR | Status: AC
  Administered 2014-02-08: 2 mg via INTRAVENOUS
  Filled 2014-02-08: qty 1

## 2014-02-08 MED ORDER — ONDANSETRON HCL 4 MG PO TABS
4.0000 mg | ORAL_TABLET | Freq: Four times a day (QID) | ORAL | Status: DC | PRN
  Filled 2014-02-08: qty 1

## 2014-02-08 MED ORDER — METOCLOPRAMIDE HCL 10 MG PO TABS
5.0000 mg | ORAL_TABLET | Freq: Three times a day (TID) | ORAL | Status: DC | PRN
Start: 2014-02-08 — End: 2014-02-11

## 2014-02-08 MED ORDER — ETODOLAC 400 MG PO TABS
400.0000 mg | ORAL_TABLET | Freq: Two times a day (BID) | ORAL | Status: DC
  Administered 2014-02-08: 400 mg via ORAL
  Filled 2014-02-08 (×8): qty 1

## 2014-02-08 MED ORDER — HYDROMORPHONE HCL PF 1 MG/ML IJ SOLN
0.5000 mg | INTRAMUSCULAR | Status: DC | PRN
  Administered 2014-02-08 (×2): 1 mg via INTRAVENOUS
  Filled 2014-02-08 (×3): qty 1

## 2014-02-08 MED ORDER — ENOXAPARIN SODIUM 40 MG/0.4ML ~~LOC~~ SOLN
40.0000 mg | SUBCUTANEOUS | Status: DC
  Administered 2014-02-08 – 2014-02-11 (×4): 40 mg via SUBCUTANEOUS
  Filled 2014-02-08 (×4): qty 0.4

## 2014-02-08 MED ORDER — HYDROCODONE-ACETAMINOPHEN 7.5-325 MG PO TABS
1.0000 | ORAL_TABLET | ORAL | Status: DC | PRN
  Administered 2014-02-08: 2 via ORAL
  Filled 2014-02-08: qty 2

## 2014-02-08 MED ORDER — PROMETHAZINE HCL 25 MG/ML IJ SOLN
12.5000 mg | Freq: Four times a day (QID) | INTRAMUSCULAR | Status: DC | PRN
  Administered 2014-02-08: 12.5 mg via INTRAVENOUS
  Filled 2014-02-08: qty 1

## 2014-02-08 MED ORDER — PANTOPRAZOLE SODIUM 40 MG PO TBEC
40.0000 mg | DELAYED_RELEASE_TABLET | Freq: Every day | ORAL | Status: DC
  Administered 2014-02-08 – 2014-02-11 (×4): 40 mg via ORAL
  Filled 2014-02-08 (×4): qty 1

## 2014-02-08 MED ORDER — DOCUSATE SODIUM 100 MG PO CAPS
100.0000 mg | ORAL_CAPSULE | Freq: Two times a day (BID) | ORAL | Status: DC
  Administered 2014-02-08 – 2014-02-11 (×7): 100 mg via ORAL
  Filled 2014-02-08 (×8): qty 1

## 2014-02-08 MED ORDER — METOCLOPRAMIDE HCL 5 MG/ML IJ SOLN: 5.0000 mg | Freq: Three times a day (TID) | INTRAMUSCULAR | Status: DC | PRN

## 2014-02-08 MED ORDER — ONDANSETRON HCL 4 MG/2ML IJ SOLN: 4.0000 mg | Freq: Four times a day (QID) | INTRAMUSCULAR | Status: DC | PRN

## 2014-02-08 MED ORDER — METHOCARBAMOL 500 MG PO TABS
500.0000 mg | ORAL_TABLET | Freq: Four times a day (QID) | ORAL | Status: DC | PRN
  Administered 2014-02-08 – 2014-02-11 (×11): 500 mg via ORAL
  Filled 2014-02-08 (×12): qty 1

## 2014-02-08 MED ORDER — OXYCODONE-ACETAMINOPHEN 5-325 MG PO TABS
1.0000 | ORAL_TABLET | ORAL | Status: DC | PRN
  Administered 2014-02-08 – 2014-02-10 (×11): 2 via ORAL
  Filled 2014-02-08 (×11): qty 2

## 2014-02-08 MED ORDER — SODIUM CHLORIDE 0.9 % IV SOLN
INTRAVENOUS | Status: DC
  Administered 2014-02-08: 11:00:00 via INTRAVENOUS
  Filled 2014-02-08 (×8): qty 1000

## 2014-02-08 MED ORDER — PROMETHAZINE HCL 25 MG/ML IJ SOLN
6.2500 mg | Freq: Once | INTRAMUSCULAR | Status: DC
  Filled 2014-02-08: qty 1

## 2014-02-08 MED ORDER — DIPHENHYDRAMINE HCL 12.5 MG/5ML PO ELIX
12.5000 mg | ORAL_SOLUTION | ORAL | Status: DC | PRN
  Administered 2014-02-08: 25 mg via ORAL
  Filled 2014-02-08 (×2): qty 10

## 2014-02-08 NOTE — Brief Op Note (Signed)
02/07/2014 - 02/08/2014  4:07 AM  PATIENT:  Kellsey Dyckman  43 y.o. female  PRE-OPERATIVE DIAGNOSIS:  Open Ankle Fracture/Right  POST-OPERATIVE DIAGNOSIS:  Grade 2 open distal tibia/fibula fracture  PROCEDURE:  Procedure(s): INTRAMEDULLARY (IM) NAIL Right TIBIA (Right) IRRIGATION right lower leg wound (Right)  SURGEON:  Surgeon(s) and Role:    * Mauri Pole, MD - Primary  ASSISTANTS: surgical team  ANESTHESIA:   general  EBL:  Total I/O In: 2500 [I.V.:2500] Out: 500 [Urine:400; Blood:100]  BLOOD ADMINISTERED:none  DRAINS: none   LOCAL MEDICATIONS USED:  NONE  SPECIMEN:  No Specimen  DISPOSITION OF SPECIMEN:  N/A  COUNTS:  YES  TOURNIQUET:   Total Tourniquet Time Documented: Thigh (Right) - 33 minutes Total: Thigh (Right) - 33 minutes   DICTATION: .Other Dictation: Dictation Number [id number]  PLAN OF CARE: Admit to inpatient   PATIENT DISPOSITION:  PACU - hemodynamically stable.   Delay start of Pharmacological VTE agent (>24hrs) due to surgical blood loss or risk of bleeding: no

## 2014-02-08 NOTE — Transfer of Care (Signed)
Immediate Anesthesia Transfer of Care Note  Patient: Stacy Russell  Procedure(s) Performed: Procedure(s): INTRAMEDULLARY (IM) NAIL Right TIBIA (Right) IRRIGATION right lower leg wound (Right)  Patient Location: PACU  Anesthesia Type:General  Level of Consciousness: awake, alert  and oriented  Airway & Oxygen Therapy: Patient Spontanous Breathing and Patient connected to nasal cannula oxygen  Post-op Assessment: Report given to PACU RN and Post -op Vital signs reviewed and stable  Post vital signs: Reviewed and stable  Complications: No apparent anesthesia complications

## 2014-02-08 NOTE — Progress Notes (Signed)
Occupational Therapy Evaluation Patient Details Name: Stacy Russell MRN: [id number] DOB: [birth year] Today's Date: 02/08/2014    History of Present Illness Fell at home missing steps with resulting open injury to right ankle, swelling left ankle. R Tib/fib fx - open. Underwent ORIF 4/27. NWB   Clinical Impression   PTA, pt independent with ADL and mobility. Pt very painful this pm but able to participate with OT to get OOB to w/c using squat pivot transfer. Pt will need to primarily use squat pivot transfers at home with use of w/c, hospital bed and drop arm BSC. Feel pt will most likely be able to D/C home Thursday if she continues to progress. She will need 24/7 S after D/C. Discussed possibility for building a ramp with husband as pt will need to /D/C home @ w/c level. Will see tomorrow to focus on family eduction with functional mobility and ADL to facilitate D/C home.    Follow Up Recommendations  No OT follow up;Supervision/Assistance - 24 hour    Equipment Recommendations  3 in 1 bedside comode;Wheelchair (measurements OT);Wheelchair cushion (measurements OT);Hospital bed (needs drop arm; wheelchair with elevating leg rests)    Recommendations for Other Services       Precautions / Restrictions Precautions Precautions: Fall Precaution Comments: L painful ankle Restrictions Weight Bearing Restrictions: Yes RLE Weight Bearing: Non weight bearing      Mobility Bed Mobility Overal bed mobility: Needs Assistance Bed Mobility: Supine to Sit     Supine to sit: Mod assist     General bed mobility comments: assistance to move RLE. vc for NWB during bed mobility  Transfers Overall transfer level: Needs assistance Equipment used: Rolling walker (2 wheeled) Transfers: Squat Pivot Transfers Sit to Stand: +2 physical assistance;Mod assist;From elevated surface   Squat pivot transfers: Mod assist;+2 physical assistance     General transfer comment: 1 person to hold leg off  floor    Balance Overall balance assessment: Modified Independent                                          ADL Overall ADL's : Needs assistance/impaired     Grooming: Set up   Upper Body Bathing: Set up   Lower Body Bathing: Moderate assistance;Bed level   Upper Body Dressing : Set up   Lower Body Dressing: Moderate assistance;Bed level   Toilet Transfer: Moderate assistance (simulated with w/c - without arm)   Toileting- Clothing Manipulation and Hygiene: Moderate assistance       Functional mobility during ADLs: Moderate assistance;+2 for physical assistance;Wheelchair;Cueing for safety;Cueing for sequencing (2nd person to hold leg) General ADL Comments: Began educating husband on transfers and care. Pt limited by nausea     Vision                     Perception     Praxis      Pertinent Vitals/Pain Pain 5/10 premedicated     Hand Dominance     Extremity/Trunk Assessment Upper Extremity Assessment Upper Extremity Assessment: Overall WFL for tasks assessed   Lower Extremity Assessment Lower Extremity Assessment: Defer to PT evaluation LLE Deficits / Details: ace wrapped to give compression, elevated and applied ice   Cervical / Trunk Assessment Cervical / Trunk Assessment: Normal   Communication Communication Communication: No difficulties   Cognition Arousal/Alertness: Awake/alert Behavior During Therapy: WFL for tasks assessed/performed Overall  Cognitive Status: Within Functional Limits for tasks assessed                     General Comments       Exercises Exercises: Other exercises Other Exercises Other Exercises: encouraged pt to elevate BLE. ICE to BLE and frequently move L toes/ankle within pain limit   Shoulder Instructions      Home Living Family/patient expects to be discharged to:: Private residence Living Arrangements: Spouse/significant other Available Help at Discharge: Family Type of Home:  House Home Access: Stairs to enter [employer] of Steps: 3 Entrance Stairs-Rails: Right Home Layout: Two level;Able to live on main level with bedroom/bathroom     Bathroom Shower/Tub:  (upstairs)   Bathroom Toilet: Standard Bathroom Accessibility: No   Home Equipment: None   Additional Comments: downstairs bathroom not accessible      Prior Functioning/Environment Level of Independence: Independent             OT Diagnosis: Generalized weakness;Acute pain   OT Problem List: Decreased strength;Decreased range of motion;Decreased activity tolerance;Impaired balance (sitting and/or standing);Decreased knowledge of use of DME or AE;Decreased knowledge of precautions;Obesity;Pain   OT Treatment/Interventions: Self-care/ADL training;Therapeutic exercise;DME and/or AE instruction;Therapeutic activities;Patient/family education;Balance training    OT Goals(Current goals can be found in the care plan section) Acute Rehab OT Goals Patient Stated Goal: [place] home OT Goal Formulation: With patient Time For Goal Achievement: 02/22/14 Potential to Achieve Goals: Good  OT Frequency: Min 3X/week   Barriers to D/C:            Co-evaluation              End of Session Equipment Utilized During Treatment: Gait belt Nurse Communication: Mobility status;Weight bearing status;Precautions  Activity Tolerance: Patient tolerated treatment well Patient left: in chair;with call bell/phone within reach;with family/visitor present   Time: 8315-1761 OT Time Calculation (min): 36 min Charges:  OT General Charges $OT Visit: 1 Procedure OT Evaluation $Initial OT Evaluation Tier I: 1 Procedure OT Treatments $Self Care/Home Management : 23-37 mins G-Codes:    Roney Jaffe Naoki Migliaccio Feb 27, 2014, 5:47 PM   Maurie Boettcher, OTR/L  [phone] 02-27-2014

## 2014-02-08 NOTE — Anesthesia Postprocedure Evaluation (Signed)
  Anesthesia Post-op Note  Patient: Stacy Russell  Procedure(s) Performed: Procedure(s): INTRAMEDULLARY (IM) NAIL Right TIBIA (Right) IRRIGATION right lower leg wound (Right)  Patient Location: PACU  Anesthesia Type:General  Level of Consciousness: awake and alert   Airway and Oxygen Therapy: Patient Spontanous Breathing  Post-op Pain: mild  Post-op Assessment: Post-op Vital signs reviewed, Patient's Cardiovascular Status Stable and Respiratory Function Stable  Post-op Vital Signs: Reviewed  Filed Vitals:   02/08/14 0130  BP: 168/104  Pulse: 113  Temp:   Resp: 10    Complications: No apparent anesthesia complications

## 2014-02-08 NOTE — Care Management Note (Signed)
CARE MANAGEMENT NOTE 02/08/2014  Patient:  Stacy Russell,Stacy Russell   Account Number:  [id number]  Date Initiated:  02/08/2014  Documentation initiated by:  Ricki Miller  Subjective/Objective Assessment:   43 yr old female s/p Right tibia IM Nailing     Action/Plan:   PT/OT eval, CM following   Anticipated DC Date:     Anticipated DC Plan:           Choice offered to / List presented to:             Status of service:  In process, will continue to follow

## 2014-02-08 NOTE — H&P (Signed)
Stacy Russell is an 43 y.o. female.   Chief Complaint: right open tibia/fibula fracture; left leg pain SEG:BTDVVOH fell at home today, missing steps with resulting open injury to right ankle, swelling left ankle. Transported to [facility] where xrays confirmed fracture right ankle, negative for fracture left ankle. Dr Alvan Dame consulted for treatment.  Past Medical History  Diagnosis Date  . Hypertension   . Hyperlipidemia   . Venous stasis   . Reflux   . Colitis   . IBS (irritable bowel syndrome)     Past Surgical History  Procedure Laterality Date  . Cholecystectomy    . Abdominal hysterectomy    . Colon surgery    . Femur fracture surgery    . Laproscopic removal of scar tissue    . Appendectomy      Family History  Problem Relation Age of Onset  . Hyperlipidemia Mother   . Hypertension Maternal Aunt   . Hypertension Maternal Uncle   . Hypertension Paternal Aunt   . Hypertension Paternal Uncle   . Cancer Other     breast and colon  . Heart attack Other   . Diabetes Other   . Heart attack Other    Social History:  reports that she has never smoked. She does not have any smokeless tobacco history on file. She reports that she does not drink alcohol or use illicit drugs.  Allergies:  Allergies  Allergen Reactions  . Codeine Itching  . Dilaudid [Hydromorphone Hcl]     Medications Prior to Admission  Medication Sig Dispense Refill  . aspirin 81 MG tablet Take 81 mg by mouth daily.      . chlorzoxazone (PARAFON) 500 MG tablet Take 500 mg by mouth 3 (three) times daily as needed for muscle spasms.      Marland Kitchen etodolac (LODINE) 400 MG tablet Take 1 tablet (400 mg total) by mouth 2 (two) times daily.  28 tablet  1  . lisinopril (PRINIVIL,ZESTRIL) 10 MG tablet Take 10 mg by mouth daily.      Marland Kitchen omeprazole (PRILOSEC) 20 MG capsule Take 20 mg by mouth daily.      . sertraline (ZOLOFT) 50 MG tablet Take 50 mg by mouth daily.        Results for orders placed during the hospital  encounter of 02/07/14 (from the past 48 hour(s))  CBC WITH DIFFERENTIAL     Status: Abnormal   Collection Time    02/07/14 12:05 PM      Result Value Ref Range   WBC 10.5  4.0 - 10.5 K/uL   RBC 4.80  3.87 - 5.11 MIL/uL   Hemoglobin 14.4  12.0 - 15.0 g/dL   HCT 42.3  36.0 - 46.0 %   MCV 88.1  78.0 - 100.0 fL   MCH 30.0  26.0 - 34.0 pg   MCHC 34.0  30.0 - 36.0 g/dL   RDW 12.5  11.5 - 15.5 %   Platelets 273  150 - 400 K/uL   Neutrophils Relative % 79 (*) 43 - 77 %   Neutro Abs 8.3 (*) 1.7 - 7.7 K/uL   Lymphocytes Relative 16  12 - 46 %   Lymphs Abs 1.7  0.7 - 4.0 K/uL   Monocytes Relative 4  3 - 12 %   Monocytes Absolute 0.4  0.1 - 1.0 K/uL   Eosinophils Relative 1  0 - 5 %   Eosinophils Absolute 0.1  0.0 - 0.7 K/uL   Basophils Relative 0  0 - 1 %   Basophils Absolute 0.0  0.0 - 0.1 K/uL  BASIC METABOLIC PANEL     Status: Abnormal   Collection Time    02/07/14 12:05 PM      Result Value Ref Range   Sodium 136 (*) 137 - 147 mEq/L   Potassium 3.9  3.7 - 5.3 mEq/L   Chloride 98  96 - 112 mEq/L   CO2 24  19 - 32 mEq/L   Glucose, Bld 113 (*) 70 - 99 mg/dL   BUN 16  6 - 23 mg/dL   Creatinine, Ser 0.64  0.50 - 1.10 mg/dL   Calcium 9.2  8.4 - 10.5 mg/dL   GFR calc non Af Amer >90  >90 mL/min   GFR calc Af Amer >90  >90 mL/min   Comment: (NOTE)     The eGFR has been calculated using the CKD EPI equation.     This calculation has not been validated in all clinical situations.     eGFR's persistently <90 mL/min signify possible Chronic Kidney     Disease.   Dg Tibia/fibula Right  02/08/2014   CLINICAL DATA:  Internal fixation of right lower leg fracture.  EXAM: RIGHT TIBIA AND FIBULA - 2 VIEW  COMPARISON:  Right tibia/fibula radiographs performed earlier today at 1:10 p.m.  FINDINGS: Four fluoroscopic C-arm images are provided from the OR. These demonstrate successful placement of an intramedullary rod and screws, transfixing the comminuted distal tibial fracture in near anatomic  alignment. Slight residual lateral tilt is seen. Cerclage wires are also noted about the distal tibia.  The comminuted displaced distal fibular fracture demonstrates mildly improved alignment. Evaluation of the soft tissues is limited on fluoroscopic images.  IMPRESSION: Successful placement of intramedullary rod and screws, transfixing the comminuted distal tibial fracture in near-anatomic alignment. Slight residual lateral tilt seen. Comminuted displaced distal fibular fracture demonstrates mildly improved alignment.   Electronically Signed   By: Garald Balding M.D.   On: 02/08/2014 05:46   Dg Tibia/fibula Right  02/07/2014   CLINICAL DATA:  Right ankle pain  EXAM: RIGHT TIBIA AND FIBULA - 2 VIEW  COMPARISON:  None.  FINDINGS: There is a severely comminuted and mildly displaced oblique fracture of the distal tibial diaphysis with apex medial angulation. There is a comminuted fracture of the distal fibular diaphysis with apex medial angulation. There is an overlying skin wound medially consistent with an open fracture. There is no other fracture or dislocation.  IMPRESSION: Severely comminuted fractures of the distal tibial diaphysis and distal fibular diaphysis with apex medial angulation.   Electronically Signed   By: Kathreen Devoid   On: 02/07/2014 13:22   Dg Ankle Complete Left  02/07/2014   CLINICAL DATA:  Left ankle pain laterally  EXAM: LEFT ANKLE COMPLETE - 3+ VIEW  COMPARISON:  None.  FINDINGS: There is no evidence of fracture, dislocation, or joint effusion. Small plantar calcaneal spur is noted. There is no evidence of arthropathy or other focal bone abnormality. Severe soft tissue swelling over the anteriorly and lateral malleolus.  IMPRESSION: No acute osseous injury of the left ankle.   Electronically Signed   By: Kathreen Devoid   On: 02/07/2014 13:20    Review of Systems  Musculoskeletal: Positive for back pain, falls and joint pain.    Blood pressure 147/79, pulse 94, temperature 98.2 F  (36.8 C), temperature source Oral, resp. rate 18, height _0  (1.778 m), weight 107.956 kg (238 lb), SpO2 100.00%. Physical  Exam  Constitutional: She is oriented to person, place, and time. She appears well-developed and well-nourished.  HENT:  Head: Normocephalic and atraumatic.  Eyes: EOM are normal. Pupils are equal, round, and reactive to light.  Neck: Normal range of motion. Neck supple.  Cardiovascular: Normal rate and regular rhythm.   GI: Soft.  Musculoskeletal: She exhibits edema and tenderness.  To both lower extremities. No tenderness to left, just edema. Right ankle with obvious deformity.  Neurological: She is alert and oriented to person, place, and time.  Skin: Skin is warm.  Psychiatric: She has a normal mood and affect.     Assessment/Plan To operating room with Dr Alvan Dame for surgical intervention; post op plan and orders per Dr Alvan Dame.  Benedetto Goad 02/08/2014, 7:52 AM

## 2014-02-08 NOTE — Progress Notes (Signed)
Utilization review completed.  

## 2014-02-08 NOTE — Evaluation (Signed)
Physical Therapy Evaluation Patient Details Name: Stacy Russell MRN: [id number] DOB: October 03, 1971 Today's Date: 02/08/2014   History of Present Illness  patient fell at home today, missing steps with resulting open injury to right ankle, swelling left ankle. Transported to [facility] where xrays confirmed fracture right ankle, negative for fracture left ankle. Dr Alvan Dame consulted for treatment.  Pt underwent ORIF to Rt. tib/fib4/27/15.  Pt  to be NWB  Clinical Impression  Stacy Russell is limited by pain in both lower extremities and NWB status of RLE.  She has pain and edema in LLE and is able to stand on it, but is not able to push up with it to assist arms in lifting body to step.  Anticipate she will need a wheelchair for home and begin with lateral squat pivot transfers. Question if she would benefit from some type of ankle support.    Follow Up Recommendations Home health PT    Equipment Recommendations  3in1 (PT);Wheelchair (measurements PT);Hospital bed;Wheelchair cushion (measurements PT);Rolling walker with 5" wheels (elevating legrests and removeable armrests)    Recommendations for Other Services OT consult     Precautions / Restrictions Restrictions Weight Bearing Restrictions: Yes RLE Weight Bearing: Non weight bearing      Mobility  Bed Mobility Overal bed mobility: +2 for physical assistance             General bed mobility comments: pt needed assist to move to edge of bed and bring RLE down to floor.  she c/o pain in knee  Transfers Overall transfer level: Needs assistance Equipment used: Rolling walker (2 wheeled) Transfers: Sit to/from Stand Sit to Stand: +2 physical assistance;Mod assist;From elevated surface         General transfer comment: Pt needed assist to lift hips off bed. She was able to activate glutes and extend hips to standing in upright.   She was able to stand about 15 seconds, but was not able to push up with arms and take a step with LLE and  maintain NWB on RLE  Ambulation/Gait             General Gait Details: pt unable to step today  Stairs            Wheelchair Mobility    Modified Rankin (Stroke Patients Only)       Balance Overall balance assessment: Modified Independent                                           Pertinent Vitals/Pain Pt with pain med on board.  She complains of pain in right lateral knee    Home Living Family/patient expects to be discharged to:: Private residence Living Arrangements: Spouse/significant other Available Help at Discharge: Family Type of Home: House Home Access: Stairs to enter Entrance Stairs-Rails: Right Entrance Stairs-Number of Steps: [place]: Two level;Able to live on main level with bedroom/bathroom Home Equipment: None      Prior Function Level of Independence: Independent               Hand Dominance        Extremity/Trunk Assessment               Lower Extremity Assessment: RLE deficits/detail;LLE deficits/detail RLE Deficits / Details: Pt in short leg ace wrap dressing.  Toes with edema but warm and moveable.  Pt c/o pain in  knee at lateral side LLE Deficits / Details: edema and tenderness to palpation at lateral malleolus  Cervical / Trunk Assessment: Normal  Communication   Communication: No difficulties  Cognition Arousal/Alertness: Awake/alert Behavior During Therapy: WFL for tasks assessed/performed Overall Cognitive Status: Within Functional Limits for tasks assessed                      General Comments General comments (skin integrity, edema, etc.): pt able to sit on the bed independently.  Able to stand with RW    Exercises        Assessment/Plan    PT Assessment Patient needs continued PT services  PT Diagnosis Acute pain;Generalized weakness;Difficulty walking   PT Problem List Decreased strength;Decreased activity tolerance;Decreased mobility;Pain;Decreased safety  awareness;Decreased knowledge of use of DME  PT Treatment Interventions DME instruction;Gait training;Functional mobility training;Therapeutic activities;Therapeutic exercise;Patient/family education;Wheelchair mobility training   PT Goals (Current goals can be found in the Care Plan section) Acute Rehab PT Goals Patient Stated Goal: to go home and stay on the first floor PT Goal Formulation: With patient Time For Goal Achievement: 02/15/14 Potential to Achieve Goals: Good    Frequency Min 5X/week   Barriers to discharge   Husband reports he and friends will be able to carry patient up the steps in a wheelchair    Co-evaluation               End of Session Equipment Utilized During Treatment: Gait belt Activity Tolerance: Patient limited by pain Patient left: in bed (with both legs elevated) Nurse Communication: Mobility status         Time: 1415-1440 PT Time Calculation (min): 25 min   Charges:   PT Evaluation $Initial PT Evaluation Tier I: 1 Procedure PT Treatments $Therapeutic Activity: 23-37 mins   PT G Codes:          Stacy Russell 02/08/2014, 2:44 PM

## 2014-02-08 NOTE — Progress Notes (Signed)
Occupational Therapy Treatment Patient Details Name: Stacy Russell MRN: [id number] DOB: 09-19-1971 Today's Date: 02/08/2014    History of present illness Fell at home missing steps with resulting open injury to right ankle, swelling left ankle. R Tib/fib fx - open. Underwent ORIF 4/27. NWB   OT comments  Pt seen for second session to educate staff on mobility and begin family education. Husband assisted with transfer. Pt limited by pain and only sat up short amount of time due to nausea. Feel pt will most likely be ready for D/C home Thursday if medically stable. Will continue to follow to address goals to facilitate safe D/C home.  Follow Up Recommendations  No OT follow up;Supervision/Assistance - 24 hour    Equipment Recommendations  3 in 1 bedside comode;Wheelchair (measurements OT);Wheelchair cushion (measurements OT);Hospital bed    Recommendations for Other Services      Precautions / Restrictions Precautions Precautions: Fall Precaution Comments: L painful ankle Restrictions Weight Bearing Restrictions: Yes RLE Weight Bearing: Non weight bearing Other Position/Activity Restrictions: vc to maintina NWB during bed mobility       Mobility Bed Mobility Overal bed mobility: Needs Assistance Bed Mobility: Sit to Supine     Supine to sit: Mod assist (assisted only with lifting leg back onto bed)     General bed mobility comments: assistance to move RLE. vc for NWB during bed mobility  Transfers Overall transfer level: Needs assistance Equipment used: Rolling walker (2 wheeled) Transfers: Stand Pivot Transfers Sit to Stand: +2 physical assistance;Mod assist;From elevated surface   Squat pivot transfers: Mod assist;+2 physical assistance     General transfer comment: 1 person to hold leg off floor    Balance Overall balance assessment: Modified Independent                                 ADL Overall ADL's : Needs assistance/impaired     Grooming:  Set up   Upper Body Bathing: Set up   Lower Body Bathing: Moderate assistance;Bed level   Upper Body Dressing : Set up   Lower Body Dressing: Moderate assistance;Bed level   Toilet Transfer: Moderate assistance (simulated with w/c - without arm)   Toileting- Clothing Manipulation and Hygiene: Moderate assistance       Functional mobility during ADLs: Moderate assistance;+2 for physical assistance;Wheelchair;Cueing for safety;Cueing for sequencing (2nd person to hold leg) General ADL Comments: focus of 2nd session of transfer training with staff and husband. Husbandd assisted by holding pt's leg. Husband able to verbalize ability to set up w/c properly and understand need to transfer to strong side using squat pivot trasnfer.       Vision                     Perception     Praxis      Cognition   Behavior During Therapy: WFL for tasks assessed/performed Overall Cognitive Status: Within Functional Limits for tasks assessed                       Extremity/Trunk Assessment  Upper Extremity Assessment Upper Extremity Assessment: Overall WFL for tasks assessed   Lower Extremity Assessment Lower Extremity Assessment: Defer to PT evaluation LLE Deficits / Details: ace wrapped to give compression, elevated and applied ice   Cervical / Trunk Assessment Cervical / Trunk Assessment: Normal    Exercises Other Exercises Other Exercises: encouraged pt to elevate  BLE. ICE to BLE and frequently move L toes/ankle within pain limit   Shoulder Instructions       General Comments      Pertinent Vitals/ Pain       Pain 5/10       C/o nausea - nsg aware  Home Living Family/patient expects to be discharged to:: Private residence Living Arrangements: Spouse/significant other Available Help at Discharge: Family Type of Home: House Home Access: Stairs to enter [employer] of Steps: 3 Entrance Stairs-Rails: Right Home Layout: Two level;Able to live on  main level with bedroom/bathroom     Bathroom Shower/Tub:  (upstairs)   Bathroom Toilet: Standard Bathroom Accessibility: No   Home Equipment: None   Additional Comments: downstairs bathroom not accessible      Prior Functioning/Environment Level of Independence: Independent            Frequency Min 3X/week     Progress Toward Goals  OT Goals(current goals can now be found in the care plan section)  Progress towards OT goals: Progressing toward goals  Acute Rehab OT Goals Patient Stated Goal: [place] home OT Goal Formulation: With patient Time For Goal Achievement: 02/22/14 Potential to Achieve Goals: Good ADL Goals Pt Will Perform Lower Body Bathing: with caregiver independent in assisting;bed level;sitting/lateral leans;with supervision Pt Will Perform Lower Body Dressing: with supervision;with caregiver independent in assisting;sitting/lateral leans Pt Will Transfer to Toilet: with supervision;bedside commode;squat pivot transfer Pt Will Perform Toileting - Clothing Manipulation and hygiene: with supervision;with caregiver independent in assisting;sitting/lateral leans  Plan Discharge plan remains appropriate    Co-evaluation                 End of Session Equipment Utilized During Treatment: Gait belt   Activity Tolerance Patient tolerated treatment well   Patient Left in bed;with call bell/phone within reach;with nursing/sitter in room;with family/visitor present   Nurse Communication Mobility status;Weight bearing status;Precautions        Time: 8416-6063 OT Time Calculation (min): 14 min  Charges: OT General Charges $OT Visit: 1 Procedure OT Evaluation $Initial OT Evaluation Tier I: 1 Procedure OT Treatments $Self Care/Home Management : 8-22 mins  Roney Jaffe Sharelle Burditt 02/08/2014, 6:02 PM   [facility], OTR/L  [phone] 02/08/2014

## 2014-02-09 LAB — CBC
HCT: 33.7 % — ABNORMAL LOW (ref 36.0–46.0)
Hemoglobin: 10.8 g/dL — ABNORMAL LOW (ref 12.0–15.0)
MCH: 29.2 pg (ref 26.0–34.0)
MCHC: 32 g/dL (ref 30.0–36.0)
MCV: 91.1 fL (ref 78.0–100.0)
PLATELETS: 186 10*3/uL (ref 150–400)
RBC: 3.7 MIL/uL — AB (ref 3.87–5.11)
RDW: 13.1 % (ref 11.5–15.5)
WBC: 7.8 10*3/uL (ref 4.0–10.5)

## 2014-02-09 LAB — BASIC METABOLIC PANEL
BUN: 7 mg/dL (ref 6–23)
CALCIUM: 8.6 mg/dL (ref 8.4–10.5)
CO2: 23 meq/L (ref 19–32)
CREATININE: 0.55 mg/dL (ref 0.50–1.10)
Chloride: 104 mEq/L (ref 96–112)
GFR calc non Af Amer: >90 mL/min (ref >90)
Glucose, Bld: 95 mg/dL (ref 70–99)
Potassium: 4 mEq/L (ref 3.7–5.3)
SODIUM: 139 meq/L (ref 137–147)

## 2014-02-09 MED ORDER — DOXYCYCLINE HYCLATE 100 MG PO TABS
100.0000 mg | ORAL_TABLET | Freq: Two times a day (BID) | ORAL | Status: DC
  Administered 2014-02-10 – 2014-02-11 (×3): 100 mg via ORAL
  Filled 2014-02-09 (×4): qty 1

## 2014-02-09 NOTE — Progress Notes (Signed)
PT Cancellation Note  Patient Details Name: Stacy Russell MRN: [id number] DOB: [birth year]   Cancelled Treatment:    Reason Eval/Treat Not Completed: Other (comment), pt awaiting CAM boot for LLE so treatment held. Then after CAM boot present, awaiting resplinting of RLE due to pain. Will check back tomorrow before d/c.   [employer] [facility] 02/09/2014, 4:14 PM

## 2014-02-09 NOTE — Progress Notes (Signed)
Orthopedic Tech Progress Note Patient Details:  Stacy Russell [birth year] [id number]  Ortho Devices Type of Ortho Device: Post (short leg) splint;Stirrup splint;Ace wrap Ortho Device/Splint Interventions: Application   Theodoro Parma Cammer 02/09/2014, 4:53 PM

## 2014-02-09 NOTE — Progress Notes (Signed)
Orthopedic Tech Progress Note Patient Details:  Stacy Russell Apr 10, 1971 [id number]  Ortho Devices Type of Ortho Device: CAM walker Ortho Device/Splint Interventions: Application   Theodoro Parma Cammer 02/09/2014, 3:15 PM

## 2014-02-09 NOTE — Op Note (Signed)
NAMEYAMNA, MACKEL               ACCOUNT NO.:  [id number]  MEDICAL RECORD NO.:  [id number]  LOCATION:  5N13C                        FACILITY:  [facility]  PHYSICIAN:  Pietro Cassis. Alvan Dame, M.D.  DATE OF BIRTH:  1971/05/09  DATE OF PROCEDURE:  02/07/2014 DATE OF DISCHARGE:                              OPERATIVE REPORT   PREOPERATIVE DIAGNOSIS:  Grade 1 to 2 open right distal tib-fib fracture.  POSTOPERATIVE DIAGNOSIS:  Grade 1 to 2 open right distal tib-fib fracture.  PROCEDURE: 1. Sharp excisional debridement of the right leg wound of skin and     subcutaneous tissue and bone and muscle debridement. 2. Open reduction and internal fixation with intramedullary nailing of     the right tibia utilizing a Biomet 35 cm nail by 9 mm diameter.     Three distal interlocks and 1 proximal interlock in addition to 2, 16-     gauge wire around the fracture site.  SURGEON:  Pietro Cassis. Alvan Dame, M.D.  ASSISTANT:  Surgical team.  ANESTHESIA:  General.  SPECIMENS:  None.  COMPLICATIONS:  None.  TOURNIQUET:  Utilized for 30 minutes of the case.  INDICATIONS FOR PROCEDURE:  Naiara is a 43 year old female who unfortunately missed the bottom step, falling down 2 steps.  At the time of her missed step, she had an immediate onset of pain, pop sensation, and fell.  She had immediate deformity, inability to bear weight with bleeding at the site.  She is brought to the [facility] Emergency Room where she was seen and evaluated and Orthopedics consulted for definitive management.  At the time of evaluation, it is recommended that she be placed in a splint with a Betadine dressing as well as receive antibiotics and update her tetanus shot.  At the time of my evaluation, she is with her husband.  We had a chance to review the fracture pattern and indication of the procedure, the goals of treatment and expected duration.  Risks of nonunion infection due to the open nature of the fracture, indications for  use of antibiotics were all discussed and reviewed and consent was obtained for benefit of fracture for care.  PROCEDURE IN DETAIL:  The patient was brought to operative theater. Once adequate anesthesia, preoperative antibiotics, Ancef again administered 2 g prior to surgery.  She was positioned supine.  A thigh tourniquet was placed.  The right lower extremity was pre-scrubbed including the foot and lower leg then prepped and draped in sterile fashion.  Time-out was performed identifying the patient, planned procedure, and extremity.  The leg was exsanguinated, tourniquet was elevated, at this point to 250 mmHg.  At this point, attention was first directed at the excisional and nonexcisional debridement of the right leg wound.  In the distal medial aspect of the leg, she was noted to have a 2-3 cm laceration to the skin.  This was not contaminated.  I lifted out this area and extended slightly in each direction to allow for an adequate debridement but also for utilization of fracture reduction.  During the course of the case, this area was utilized for fracture reduction and visualization of the fracture.  The anterior neurovascular structures were identified  and found to be intact as were the tendon structures.  No evidence of any soft tissue impingement in the fracture site.  Following this excisional debridement sharply with a scalpel, the skin, subcutaneous tissue, muscle, and bone we attended to the proximal aspect of the leg for insertion of the nail.  A para midline incision was made followed by a para midline approach to the medial aspect of the patella identifying the extra-articular surface of the anterior tibia.  First a guidewire was placed and confirmed radiographically in its orientation and then starting awl.  The awl was then placed into the proximal tibia. Confirmed radiographically in location and the ball-tip guidewire passed.  At this point, I had placed a  clamp on the fracture site distally to maintain reduction near anatomic position.  The guidewire was placed down into the center portion of the distal tibia at the physial scar.  Once oriented and confirmed radiographically, I opened up the proximal tibia with starting reamer and then reamed up to 11.5 mm with good chatter in the isthmus of the tibia thus reamed up to 10.5 mm selecting the 9 mm nail.  I measured the depth of the nail which was 35 cm.  The nail was opened and passed by hand over the guidewire.  At this point, the guidewire was removed.  In the anterior-posterior view, the fracture appeared to be anatomically reviewed, however, when we looked laterally there was separation and also identified the first pass of this nail that the nail was positioned into the anterior aspect of distal tibia most likely resultant displacement, it was identified on the lateral radiograph.  Given this, I made several different attempts at maintaining reduction, left knee with slight displacement of the fracture despite re-orienting the nail into the central posterior aspect of the distal tibia.  Once I had the nail in its final position, there was still some mild displacement laterally.  Once I was satisfied with the overall reduction, at this point with a clamp applied on this fracture site, I did place the proximal interlock through the insertion jig and then removed the jig.  This allowed the knee to come out in extension easily and the distal interlocks were placed all 3, 2 from medial to lateral, and 1 from anterior to posterior.  Again, I still had this mild displacement of fracture and I elected at this point based on the proximity of the skin distally in the tibia to use two 16-gauge wires wrapped around her tibia to maintain this reduced fashion.  The wires were tightened down and then clipped and bent down to avoid soft tissue impingement.  Final radiographs were obtained in AP  and lateral planes.  The wounds were copiously irrigated throughout the case.  I had non excisionally debrided the leg wound with a liter of normal saline solution at the beginning of the case.  I then used a second liter of fluid to irrigate all wounds.  Her right leg was then cleaned, dried, and dressed sterilely.  The proximal wound was closed in layers, #1 Vicryl on the extensor mechanism.  The remaining wound was closed with 2-0 Vicryl and running 4-0 Monocryl.  The stab incisions for the screw placements were closed with 2-0 Vicryl and Dermabond proximally.  The skin proximally is closed with Dermabond and Mepilex dressing.  Distally, I reapproximated the lacerated site with 2-0 Vicryl and 2-0 nylon.  The puncture wounds again reapproximated using 2-0 Vicryl.  At this point, all wounds  were cleaned.  The distal wounds were cleaned and dressed in Xeroform and a bulky wrap.  Then, she was placed into a splint with the foot in neutral position.  The findings and radiographs were reviewed with her husband explaining the added duration at the hopes of maximizing her anatomic alignment of fracture reduction.  She will be admitted to the hospital, work with physical therapy over the next 1-3 days.  She will be nonweightbearing on this right lower extremity until fracture healing.  She will remain on antibiotics for 48 hours.  She will be seen back in the office in 2 weeks for radiographic evaluation.  She will need to be placed into a cast based on the nature of her fracture and the comminuted nature of the distal fibula being uncomfortable.  Findings again reviewed with family.     Pietro Cassis Alvan Dame, M.D.     MDO/MEDQ  D:  02/08/2014  T:  02/08/2014  Job:  [id number]

## 2014-02-09 NOTE — Progress Notes (Signed)
Occupational Therapy Treatment Patient Details Name: Stacy Russell MRN: [id number] DOB: [birth year] Today's Date: 02/09/2014    History of present illness Fell at home missing steps with resulting open injury to right ankle, swelling left ankle. R Tib/fib fx - open. Underwent ORIF 4/27. NWB   OT comments  Education completed regarding ADL, home set up , compression wrapping L ankle, donning/doffing cam boot. Will return after R LE splint repositioned to further educate spouse on transfers for ADL.  Follow Up Recommendations  No OT follow up;Supervision/Assistance - 24 hour    Equipment Recommendations  3 in 1 bedside comode;Wheelchair (measurements OT);Wheelchair cushion (measurements OT);Hospital bed (drop arm)    Recommendations for Other Services      Precautions / Restrictions Precautions Precautions: Fall Precaution Comments: L painful ankle Required Braces or Orthoses: Other Brace/Splint (L cam boot) Restrictions RLE Weight Bearing: Non weight bearing       Mobility Bed Mobility Overal bed mobility: Needs Assistance             General bed mobility comments: discussed compensatory techniques for bed mobility. using LLE toassist or gently assisting with sheet  Transfers                 General transfer comment: not performed this session as waiting on ortho to redo splint on RLE    Balance                                   ADL Overall ADL's : Needs assistance/impaired                                       General ADL Comments: Educated pt/husband on compensatory techniques for ADL. Easiest and least painful to complete at bed level. Educated on available AE - use of reacher to assist with ADL. Educated husband on compression wrapping L ankle with ace wrap. given handout. Discussed home set up and recommendations for set up of DME . discussed concerns regarding abilityto take shower and option of bumping up steps to upstairs.  Rec to discuss showering with MD and wait for HHPT to teach how to bujmp up steps.        Vision                     Perception     Praxis      Cognition   Behavior During Therapy: [facility] for tasks assessed/performed Overall Cognitive Status: Within Functional Limits for tasks assessed                       Extremity/Trunk Assessment               Exercises     Shoulder Instructions       General Comments      Pertinent Vitals/ Pain       5/10 RLE requested pain meds. Ice   Home Living    see eval                                      Prior Functioning/Environment     see evla         Frequency Min 3X/week     Progress Toward  Goals  OT Goals(current goals can now be found in the care plan section)  Progress towards OT goals: Progressing toward goals  Acute Rehab OT Goals Patient Stated Goal: [place] home OT Goal Formulation: With patient Time For Goal Achievement: 02/22/14 Potential to Achieve Goals: Good ADL Goals Pt Will Perform Lower Body Bathing: with caregiver independent in assisting;bed level;sitting/lateral leans;with supervision Pt Will Perform Lower Body Dressing: with supervision;with caregiver independent in assisting;sitting/lateral leans Pt Will Transfer to Toilet: with supervision;bedside commode;squat pivot transfer Pt Will Perform Toileting - Clothing Manipulation and hygiene: with supervision;with caregiver independent in assisting;sitting/lateral leans  Plan Discharge plan remains appropriate    Co-evaluation                 End of Session  in bed; with call bell. Family present   Activity Tolerance Patient tolerated treatment well   Patient Left in bed;with call bell/phone within reach;with family/visitor present   Nurse Communication Mobility status;Patient requests pain meds        Time: 1455-1535 OT Time Calculation (min): 40 min  Charges: OT General Charges $OT Visit: 1  Procedure OT Treatments $Self Care/Home Management : 38-52 mins  Roney Jaffe Tonye Tancredi 02/09/2014, 4:11 PM   [facility], OTR/L  [phone] 02/09/2014

## 2014-02-09 NOTE — Progress Notes (Signed)
Patient ID: Stacy Russell, female   DOB: [birth year], 43 y.o.   MRN: [id number] Subjective: 2 Days Post-Op Procedure(s) (LRB): INTRAMEDULLARY (IM) NAIL Right TIBIA (Right) IRRIGATION right lower leg wound (Right)    Patient reports pain as moderate.  Most pain around knee cap - explained to her source from nail entry Left ankle pain (no fracture from initial ER eval), in ACE wrap now  Objective:   VITALS:   Filed Vitals:   02/09/14 0508  BP: 123/57  Pulse: 82  Temp: 98.7 F (37.1 C)  Resp: 18    Sensation intact distally splint intact Some discomfort posterior calf Brisk capillary refill  LABS  Recent Labs  02/07/14 1205 02/09/14 0350  HGB 14.4 10.8*  HCT 42.3 33.7*  WBC 10.5 7.8  PLT 273 186     Recent Labs  02/07/14 1205 02/09/14 0350  NA 136* 139  K 3.9 4.0  BUN 16 7  CREATININE 0.64 0.55  GLUCOSE 113* 95    No results found for this basename: LABPT, INR,  in the last 72 hours   Assessment/Plan: 2 Days Post-Op Procedure(s) (LRB): INTRAMEDULLARY (IM) NAIL Right TIBIA (Right) IRRIGATION right lower leg wound (Right)   Plan for discharge tomorrow Discharge home with home health  Will have Ortho tech place in cam walker to left leg and modify posterior splint so as to decrease calf irratation   Arrange home needs

## 2014-02-09 NOTE — Progress Notes (Signed)
Occupational Therapy Treatment Patient Details Name: Stacy Russell MRN: [id number] DOB: [birth year] Today's Date: 02/09/2014    History of present illness Fell at home missing steps with resulting open injury to right ankle, swelling left ankle. R Tib/fib fx - open. Underwent ORIF 4/27. NWB   OT comments  Making excellent progress. Husband participating in transfers and feeling more confident about taking his wife home. Plan to see pt in am prior to D/C to answer any further questions to facilitate safe D/C home. Pt will be readyto D/C home after therapy if medically stable.  Follow Up Recommendations  No OT follow up;Supervision/Assistance - 24 hour    Equipment Recommendations  3 in 1 bedside comode;Wheelchair (measurements OT);Wheelchair cushion (measurements OT);Hospital bed    Recommendations for Other Services      Precautions / Restrictions Precautions Precautions: Fall Precaution Comments: L painful ankle Required Braces or Orthoses: Other Brace/Splint (cam boot) Restrictions RLE Weight Bearing: Non weight bearing       Mobility Bed Mobility Overal bed mobility: Needs Assistance Bed Mobility: Supine to Sit     Supine to sit: Min assist     General bed mobility comments: husband assisted with bed moiblity  Transfers Overall transfer level: Needs assistance   Transfers: Squat Pivot Transfers - min A           General transfer comment: Husband assisted with transfer. Encouraged pt to use momentum to complete transferes. Pt States L foot feels better with use of cam boot.     Balance                                   ADL Overall ADL's : Needs assistance/impaired                                       General ADL Comments: husband independently asisted wife with cam boot. All education completed regarding ADL. Educated on proper positioning RLE to give support to entire RLE when OOB. Husband verbalized understanding.       Vision                     Perception     Praxis      Cognition   Behavior During Therapy: [facility] for tasks assessed/performed Overall Cognitive Status: Within Functional Limits for tasks assessed                       Extremity/Trunk Assessment               Exercises Other Exercises Other Exercises: given theraband for UE strengtheing   Shoulder Instructions       General Comments      Pertinent Vitals/ Pain       4/10 RLE. Repositioned. Ice  Home Living                                          Prior Functioning/Environment              Frequency Min 3X/week     Progress Toward Goals  OT Goals(current goals can now be found in the care plan section)  Progress towards OT goals: Progressing toward goals  Acute Rehab OT  Goals Patient Stated Goal: [place] home OT Goal Formulation: With patient Time For Goal Achievement: 02/22/14 Potential to Achieve Goals: Good ADL Goals Pt Will Perform Lower Body Bathing: with caregiver independent in assisting;bed level;sitting/lateral leans;with supervision Pt Will Perform Lower Body Dressing: with supervision;with caregiver independent in assisting;sitting/lateral leans Pt Will Transfer to Toilet: with supervision;bedside commode;squat pivot transfer Pt Will Perform Toileting - Clothing Manipulation and hygiene: with supervision;with caregiver independent in assisting;sitting/lateral leans  Plan Discharge plan remains appropriate    Co-evaluation                 End of Session  in chair with call bell. Family present   Activity Tolerance Patient tolerated treatment well   Patient Left in chair;with call bell/phone within reach;with family/visitor present   Nurse Communication Mobility status;Precautions;Weight bearing status        Time: 3545-6256 OT Time Calculation (min): 26 min  Charges: OT General Charges $OT Visit: 1 Procedure OT Treatments $Self  Care/Home Management : 23-37 mins  Roney Jaffe Weylin Plagge 02/09/2014, 5:37 PM   [facility], OTR/L  [phone] 02/09/2014

## 2014-02-09 NOTE — Care Management Note (Signed)
CARE MANAGEMENT NOTE 02/09/2014  Patient:  Nicholson,Shaunta   Account Number:  [id number]  Date Initiated:  02/08/2014  Documentation initiated by:  Ricki Miller  Subjective/Objective Assessment:   43 yr old female s/p Right tibia IM Nailing     Action/Plan:   PT/OT eval, CM following   Anticipated DC Date:  02/10/2014   Anticipated DC Plan:  [place]  CM consult      [place]   Choice offered to / List presented to:  C-1 Patient   DME arranged  [place]  3-N-1  HOSPITAL BED      DME agency  [place] arranged  [place].   Status of service:  In process, will continue to follow

## 2014-02-10 DIAGNOSIS — S93402A Sprain of unspecified ligament of left ankle, initial encounter: Secondary | ICD-10-CM

## 2014-02-10 LAB — BASIC METABOLIC PANEL
BUN: 6 mg/dL (ref 6–23)
CHLORIDE: 101 meq/L (ref 96–112)
CO2: 26 mEq/L (ref 19–32)
Calcium: 9.1 mg/dL (ref 8.4–10.5)
Creatinine, Ser: 0.59 mg/dL (ref 0.50–1.10)
GFR calc Af Amer: >90 mL/min (ref >90)
GFR calc non Af Amer: >90 mL/min (ref >90)
Glucose, Bld: 94 mg/dL (ref 70–99)
POTASSIUM: 3.8 meq/L (ref 3.7–5.3)
Sodium: 139 mEq/L (ref 137–147)

## 2014-02-10 LAB — CBC
HEMATOCRIT: 33.8 % — AB (ref 36.0–46.0)
Hemoglobin: 10.8 g/dL — ABNORMAL LOW (ref 12.0–15.0)
MCH: 29.3 pg (ref 26.0–34.0)
MCHC: 32 g/dL (ref 30.0–36.0)
MCV: 91.6 fL (ref 78.0–100.0)
Platelets: 220 10*3/uL (ref 150–400)
RBC: 3.69 MIL/uL — ABNORMAL LOW (ref 3.87–5.11)
RDW: 13.1 % (ref 11.5–15.5)
WBC: 7.9 10*3/uL (ref 4.0–10.5)

## 2014-02-10 MED ORDER — DOXYCYCLINE HYCLATE 100 MG PO TABS: 100.0000 mg | ORAL_TABLET | Freq: Two times a day (BID) | ORAL | Status: DC

## 2014-02-10 MED ORDER — METHOCARBAMOL 500 MG PO TABS: 500.0000 mg | ORAL_TABLET | Freq: Four times a day (QID) | ORAL | Status: DC | PRN

## 2014-02-10 MED ORDER — OXYCODONE HCL 5 MG PO TABS
5.0000 mg | ORAL_TABLET | ORAL | Status: DC | PRN
  Administered 2014-02-10 (×3): 10 mg via ORAL
  Administered 2014-02-10 – 2014-02-11 (×5): 15 mg via ORAL
  Filled 2014-02-10: qty 3
  Filled 2014-02-10 (×2): qty 2
  Filled 2014-02-10 (×3): qty 3
  Filled 2014-02-10 (×2): qty 2
  Filled 2014-02-10: qty 1

## 2014-02-10 MED ORDER — DSS 100 MG PO CAPS: 100.0000 mg | ORAL_CAPSULE | Freq: Two times a day (BID) | ORAL | Status: AC

## 2014-02-10 MED ORDER — OXYCODONE HCL 5 MG PO TABS: 5.0000 mg | ORAL_TABLET | ORAL | Status: DC | PRN

## 2014-02-10 MED ORDER — ENOXAPARIN SODIUM 40 MG/0.4ML ~~LOC~~ SOLN: 40.0000 mg | SUBCUTANEOUS | Status: DC

## 2014-02-10 NOTE — Progress Notes (Signed)
C/O ace splint feeling to tight leg elevated ice ortho tech called to check toes warm able to wiggle

## 2014-02-10 NOTE — Progress Notes (Signed)
Patient ID: Stacy Russell, female   DOB: 04/28/1971, 42 y.o.   MRN: [id number]   Subjective: 3 Days Post-Op Procedure(s) (LRB): INTRAMEDULLARY (IM) NAIL Right TIBIA (Right) IRRIGATION right lower leg wound (Right)   Patient reports pain as moderate. Reports improvement in pain and function with addition of cam boot to left ankle  Objective:   VITALS:   Filed Vitals:   02/10/14 0700  BP:   Pulse: 92  Temp:   Resp:     ABD soft Neurovascular intact Sensation intact distally  LABS  Recent Labs  02/07/14 1205 02/09/14 0350 02/10/14 0747  HGB 14.4 10.8* 10.8*  HCT 42.3 33.7* 33.8*  WBC 10.5 7.8 7.9  PLT 273 186 220     Recent Labs  02/07/14 1205 02/09/14 0350 02/10/14 0747  NA 136* 139 139  K 3.9 4.0 3.8  BUN 16 7 6  CREATININE 0.64 0.55 0.59  GLUCOSE 113* 95 94     Assessment/Plan: 3 Days Post-Op Procedure(s) (LRB): INTRAMEDULLARY (IM) NAIL Right TIBIA (Right) IRRIGATION right lower leg wound (Right) Left ankle sprain    Up with therapy D/C IV fluids Discharge home with home health if PT goals for safe transfer and ambulation met today, and OT goals met as well. Otherwise, will plan for home tomorrow   J. Blair Roberts PAC [phone]  02/10/2014, 10:14 AM  

## 2014-02-10 NOTE — Progress Notes (Signed)
Physical Therapy Treatment Patient Details Name: Stacy Russell MRN: [id number] DOB: 02-13-1971 Today's Date: 02/10/2014    History of Present Illness Fell at home missing steps with resulting open injury to right ankle, swelling left ankle. R Tib/fib fx - open. Underwent ORIF 4/27. NWB RLE. Cam boot for LLE.    PT Comments    Pt tolerated treatment well and is progressing towards physical therapy goals. Although unable to ambulate due to NWB status on RLE and painful LLE, pt shows great improvement in her ability to perform transfers and is demonstrating safe understanding with her husband. Both report feeling more confident and believe they can safely mobilize when d/c home. She will greatly benefit from continued skilled PT in home setting to further improve independence with functional mobility.   Follow Up Recommendations  Home health PT     Equipment Recommendations  3in1 (PT);Wheelchair (measurements PT);Hospital bed;Wheelchair cushion (measurements PT);Rolling walker with 5" wheels    Recommendations for Other Services OT consult     Precautions / Restrictions Precautions Precautions: Fall Precaution Comments: L painful ankle Required Braces or Orthoses: Other Brace/Splint (CAM BOOT LLE) Restrictions Weight Bearing Restrictions: Yes RLE Weight Bearing: Non weight bearing LLE Weight Bearing: Weight bearing as tolerated (in cam boot)    Mobility  Bed Mobility Overal bed mobility: Needs Assistance Bed Mobility: Supine to Sit     Supine to sit: Min assist (RLE only)        Transfers Overall transfer level: Needs assistance Equipment used: Rolling walker (2 wheeled) Transfers: Risk manager;Sit to/from Stand Sit to Stand: Min assist Stand pivot transfers: Min assist       General transfer comment: Min assist with sit<>stand and stand pivot transfer. Progressed to min guard throughout therapy. Pt performed final transfer from [facility] to bed with husband while  PT directed for safe technique. Session consisted of transfer training and education to improve pt confidence and understanding for safe mobility. Cues for hand placement and technique. Demonstrating good ability to pivot on LLE. Overall performed transfers from recliner to w/c; w/c to bsc; and bsc to bed. Occasional cue needed for NWB on RLE when standing. Pt and husband both state they are feeling more confident about her abilities.  Ambulation/Gait                 Stairs            Wheelchair Mobility    Modified Rankin (Stroke Patients Only)       Balance Overall balance assessment: Needs assistance Sitting-balance support: Feet supported;No upper extremity supported Sitting balance-Leahy Scale: Fair     Standing balance support: Bilateral upper extremity supported Standing balance-Leahy Scale: Poor                      Cognition Arousal/Alertness: Awake/alert Behavior During Therapy: WFL for tasks assessed/performed Overall Cognitive Status: Within Functional Limits for tasks assessed                      Exercises      General Comments General comments (skin integrity, edema, etc.): Reviewed precautions (NWB status) and safe mobility with transfers. Pt needs occasional verbal cues for NWB on RLE when standing. Provided handout and reviewed ascending/descending stairs with husband/pt. They have no further questions at this time.      Pertinent Vitals/Pain Pt states RLE painful when in dependent position. No numerical value given. Patient repositioned in bed for comfort  Home Living                      Prior Function            PT Goals (current goals can now be found in the care plan section) Progress towards PT goals: Progressing toward goals    Frequency  Min 5X/week    PT Plan Current plan remains appropriate    Co-evaluation             End of Session Equipment Utilized During Treatment: Gait belt;Other  (comment) (cam boot LLE) Activity Tolerance: Patient tolerated treatment well Patient left: in bed;with call bell/phone within reach;with family/visitor present     Time: 1448-1856 PT Time Calculation (min): 37 min  Charges:  $Therapeutic Activity: 23-37 mins                    G Codes:      Elayne Snare, [place] 02/10/2014, 11:51 AM

## 2014-02-10 NOTE — Progress Notes (Signed)
Occupational Therapy Treatment Patient Details Name: Stacy Russell MRN: [id number] DOB: 13-Jun-1971 Today's Date: 02/10/2014    History of present illness Fell at home missing steps with resulting open injury to right ankle, swelling left ankle. R Tib/fib fx - open. Underwent ORIF 4/27. NWB RLE. Cam boot for LLE.   OT comments  This 43 yo female not making progress this PM due to increase pain in LLE even with can boot on--pt did a lot this AM with OT and PT and perhaps this has irritated left ankle more (ice applied at end of session).   Follow Up Recommendations  No OT follow up (A prn)    Equipment Recommendations  3 in 1 bedside comode;Wheelchair (measurements OT);Wheelchair cushion (measurements OT);Hospital bed       Precautions / Restrictions Precautions Precautions: Fall Precaution Comments: L painful ankle Required Braces or Orthoses:  (cam boot) Restrictions Weight Bearing Restrictions: Yes RLE Weight Bearing: Non weight bearing LLE Weight Bearing: Weight bearing as tolerated (in cam boot)       Mobility Bed Mobility Overal bed mobility: Needs Assistance Bed Mobility: Supine to Sit;Sit to Supine     Supine to sit: Min assist (RLE only) Sit to supine: Min assist (for RLE only)   General bed mobility comments: husband A her with leg                   Frequency Min 3X/week     Progress Toward Goals  OT Goals(current goals can now be found in the care plan section)  Progress towards OT goals: Not progressing toward goals - comment (due to LLE feeling worse this afternoon and pt states possibly becasue she is more tired)     Plan Discharge plan remains appropriate       End of Session Equipment Utilized During Treatment: Rolling walker;Gait belt   Activity Tolerance Patient tolerated treatment well   Patient Left in bed;with call bell/phone within reach (ice applied to both LEs)   Nurse Communication  (pt wants to stay one more night to continue  to practice transfers, please ask MD if they want any ROM started on RLE, and pt wants to know if she can have IV out due to it is really bothering her for transfers)        Time: 4503-8882 OT Time Calculation (min): 44 min  Charges: OT Treatments $Self Care/Home Management : 38-52 mins  Almon Register [phone] 02/10/2014, 3:23 PM

## 2014-02-10 NOTE — Discharge Summary (Signed)
Physician Discharge Summary  Patient ID: Stacy Russell MRN: [id number] DOB/AGE: 1971-07-06 43 y.o.  Admit date: 02/07/2014 Discharge date:  02/10/14   Procedures:  Procedure(s) (LRB): INTRAMEDULLARY (IM) NAIL Right TIBIA (Right) IRRIGATION right lower leg wound (Right)  Attending Physician:  Dr. Paralee Cancel   Admission Diagnoses:   Right ankle open fracture Left ankle sprain   Discharge Diagnoses:  Active Problems:   Open fracture of right fibula and tibia   Fracture of tibia, distal, left, open   Left ankle sprain  Past Medical History  Diagnosis Date  . Hypertension   . Hyperlipidemia   . Venous stasis   . Reflux   . Colitis   . IBS (irritable bowel syndrome)     HPI:    Patient admitted for ORIF open distal tibial fracture; with perioperative antibiotics and pain control with post operative DVT prophylaxis and PT/OT. Hospital course uneventful for complication. Patient to be discharged when PT/OT goals for safe ambulation and transfer are met.    PCP: Rubbie Battiest, MD   Discharged Condition: stable  Hospital Course:  Patient underwent the above stated procedure on 02/07/2014. Patient tolerated the procedure well and brought to the recovery room in good condition and subsequently to the floor.  POD #1 P: 147/79 ; Pulse: 94 ; Temp: 98.2 F (36.8 C) ; Resp: 18 Patient reports pain as moderate. Began PT/OT, foley discontinued. Sensation intact distally, splint intact, some discomfort posterior calf and brisk capillary refill.  POD #2  P: 123/57 ; Pulse: 82 ; Temp: 98.7 F (37.1 C) ; Resp: 18 Patient reports pain as moderate. Most pain around knee cap - explained to her source from nail entry  Left ankle pain (no fracture from initial ER eval), in ACE wrap now Sensation intact distally, splint intact, some discomfort posterior calf and brisk capillary refill.  LABS  Basename    HGB  10.8  HCT  33.7   POD #3  P: 137/66 ; Pulse: 137 ; Temp: 98 F (36.7 C)  ; Resp: 18 Patient reports pain as moderate.  Reports improvement in pain and function with addition of cam boot to left ankle.  Ready to be discharged home. Sensation intact distally, splint intact, some discomfort posterior calf and brisk capillary refill.No new complaints  LABS  Basename    HGB  10.8  HCT  33.8   Discharge Exam: splint on right lower extremity in good repair; cam boot properly applied  Disposition:   Home  with follow up in 2 weeks. Discharging on doxycycline for treatment for open wound prophylaxis.   Follow-up Information   Follow up with [facility]. (Someone from [facility] will contact you concerning start date and time for Nurse and physical therapist.)    Contact information:   [address] [place] [address] [phone]       Follow up with Mauri Pole, MD. Schedule an appointment as soon as possible for a visit in 2 weeks.   Specialty:  Orthopedic Surgery   Contact information:   [address] [place] [address] [phone]       Discharge Orders   Future Orders Complete By Expires   Call MD / Call 911  As directed    Constipation Prevention  As directed    Diet - low sodium heart healthy  As directed    Discharge instructions  As directed    Non weight bearing  As directed  Questions:     Laterality:  right   Extremity:  Lower   Patient may shower  As directed    Weight bearing as tolerated  As directed    Questions:     Laterality:  left   Extremity:  Lower        Medication List    STOP taking these medications       aspirin 81 MG tablet     chlorzoxazone 500 MG tablet  Commonly known as:  PARAFON     etodolac 400 MG tablet  Commonly known as:  LODINE      TAKE these medications       doxycycline 100 MG tablet  Commonly known as:  VIBRA-TABS  Take 1 tablet (100 mg total) by mouth every 12 (twelve) hours.     DSS 100 MG Caps  Take 100 mg by mouth 2  (two) times daily.     enoxaparin 40 MG/0.4ML injection  Commonly known as:  LOVENOX  Inject 0.4 mLs (40 mg total) into the skin daily.     lisinopril 10 MG tablet  Commonly known as:  PRINIVIL,ZESTRIL  Take 10 mg by mouth daily.     methocarbamol 500 MG tablet  Commonly known as:  ROBAXIN  Take 1 tablet (500 mg total) by mouth every 6 (six) hours as needed for muscle spasms.     omeprazole 20 MG capsule  Commonly known as:  PRILOSEC  Take 20 mg by mouth daily.     oxyCODONE 5 MG immediate release tablet  Commonly known as:  Oxy IR/ROXICODONE  Take 1-3 tablets (5-15 mg total) by mouth every 4 (four) hours as needed for moderate pain or severe pain.     sertraline 50 MG tablet  Commonly known as:  ZOLOFT  Take 50 mg by mouth daily.         Signed: Buck Mam PA-C [phone]    [place]. Massimiliano Rohleder   PAC  03/02/2014, 2:12 PM

## 2014-02-10 NOTE — Care Management Note (Signed)
CARE MANAGEMENT NOTE 02/10/2014  Patient:  Stacy Russell,Stacy Russell   Account Number:  [id number]  Date Initiated:  02/08/2014  Documentation initiated by:  Ricki Miller  Subjective/Objective Assessment:   43 yr old female s/p Right tibia IM Nailing     Action/Plan:   PT/OT eval, CM following   Anticipated DC Date:  02/10/2014   Anticipated DC Plan:  [place]  CM consult      [name]   Choice offered to / List presented to:  C-1 Patient   DME arranged  [place]  3-N-1  HOSPITAL BED      DME agency  [place] arranged  [place].   Status of service:  Completed, signed off Medicare Important Message given?   (If response is "NO", the following Medicare IM given date fields will be blank) Date Medicare IM given:   Date Additional Medicare IM given:    Discharge Disposition:  [place]  Per UR Regulation:    If discussed at Long Length of Stay Meetings, dates discussed:    Comments:  02/09/14 0945 Ricki Miller, RN BSN Case Manager [phone] Case manager spoke with patient concerning home health and DME needs at discharge. Choice offered. Referral called to [facility], [facility]. Patient states she has family support at discharge.

## 2014-02-10 NOTE — Progress Notes (Signed)
Dr Alvan Dame called about pt not ready to be discharged until am ok with it IV d/cd

## 2014-02-10 NOTE — Progress Notes (Signed)
Occupational Therapy Treatment Patient Details Name: Stacy Russell MRN: [id number] DOB: 01-24-1971 Today's Date: 02/10/2014    History of present illness Fell at home missing steps with resulting open injury to right ankle, swelling left ankle. R Tib/fib fx - open. Underwent ORIF 4/27. NWB   OT comments  This 43 yo female making progress, now able to tolerate weight on LLE in cam boot so can work more on stand pivot transfers with RW. Will see for one more session for anterior<>posterior transfers to give them another option.  Follow Up Recommendations  No OT follow up;Other (comment)    Equipment Recommendations  3 in 1 bedside comode;Wheelchair (measurements OT);Wheelchair cushion (measurements OT);Hospital bed       Precautions / Restrictions Precautions Precautions: Fall Required Braces or Orthoses: Other Brace/Splint (LLE) Restrictions Weight Bearing Restrictions: Yes RLE Weight Bearing: Non weight bearing LLE Weight Bearing: Weight bearing as tolerated (in cam boot)       Mobility Bed Mobility Overal bed mobility: Needs Assistance Bed Mobility: Supine to Sit     Supine to sit: Min assist (RLE only)        Transfers Overall transfer level: Needs assistance Equipment used: Rolling walker (2 wheeled) Transfers: Stand Pivot Transfers Sit to Stand:  (min A from bed and 3n1, mod A from recliner (lower)) Stand pivot transfers: Min assist       General transfer comment: For sit>stand I had pt have LUE on RW and RUE pushing up/reaching back from/to seated surface    Balance Overall balance assessment: Needs assistance Sitting-balance support: Feet supported;No upper extremity supported Sitting balance-Leahy Scale: Fair     Standing balance support: Bilateral upper extremity supported Standing balance-Leahy Scale: Poor                     ADL                           Toilet Transfer: Minimal assistance;RW (stand pivot on LLE in [place] (has  to come up on LLE toes to be able to pivot LLE in cam boot)   Toileting- Clothing Manipulation and Hygiene: Total assistance;Sit to/from stand       Functional mobility during ADLs:  (Min A sit<>stand bed and 3n1; Mod A sit<>stand from recliner (lower)) General ADL Comments: husband A pt with sit<>stand transfer from recliner making sure to A her with gait belt sit>stand and gait belt as well as helping her to slide out RLE with stand>sit. Gave husband handout on shampooing pt's hair in bed. Talked to pt and family about wearing as few clothes as she felt comfortable at home to increased independence and safety with toileting.                Cognition   Behavior During Therapy: WFL for tasks assessed/performed Overall Cognitive Status: Within Functional Limits for tasks assessed                                    Pertinent Vitals/ Pain       RLE only hurts when in a dependent position--otherwise pt reports no pain--ice was applied at end of session and Bil LEs placed on          Pillows.         Frequency Min 3X/week     Progress Toward Goals  OT Goals(current goals can  now be found in the care plan section)  Progress towards OT goals: Progressing toward goals (now stand pivot transfers)     Plan Discharge plan remains appropriate       End of Session Equipment Utilized During Treatment: Gait belt;Rolling walker   Activity Tolerance Patient tolerated treatment well   Patient Left in chair;with call bell/phone within reach;with family/visitor present   Nurse Communication  (NT: pt requests bed to be changed)        Time: 1749-4496 OT Time Calculation (min): 48 min  Charges: OT General Charges $OT Visit: 1 Procedure OT Treatments $Self Care/Home Management : 38-52 mins  Almon Register [phone] 02/10/2014, 9:34 AM

## 2014-02-11 ENCOUNTER — Encounter (HOSPITAL_COMMUNITY): Payer: Self-pay | Admitting: Orthopedic Surgery

## 2014-02-11 MED ORDER — ENOXAPARIN SODIUM 40 MG/0.4ML ~~LOC~~ SOLN: 40.0000 mg | SUBCUTANEOUS | Status: DC

## 2014-02-11 NOTE — Progress Notes (Signed)
Occupational Therapy Treatment and Discharge Patient Details Name: Stacy Russell MRN: [id number] DOB: 05-Aug-1971 Today's Date: 02/11/2014    History of present illness Fell at home missing steps with resulting open injury to right ankle, swelling left ankle. R Tib/fib fx - open. Underwent ORIF 4/27. NWB RLE. Cam boot for LLE.   OT comments  This 43 yo female making progress and all education has been completed. I have made PT Rolla Plate) aware that pt would like her PT session today being getting in the car to go home. Acute OT will sign off.  Follow Up Recommendations  No OT follow up (A from husband prn)    Equipment Recommendations  3 in 1 bedside comode;Wheelchair (measurements OT);Wheelchair cushion (measurements OT);Hospital bed       Precautions / Restrictions Precautions Precautions: Fall Precaution Comments: L painful ankle Required Braces or Orthoses:  (cam boot for LLE) Restrictions Weight Bearing Restrictions: Yes RLE Weight Bearing: Non weight bearing LLE Weight Bearing: Weight bearing as tolerated (in cam boot)              ADL                                         General ADL Comments: Pt reports that she is feeling much better about things today, that she and her husband have done the BSC(3n1) transfer about 5 times and it went well. We then talked about going home in [place] v. buick car and finally decided that the car would finally be better since she could go into the back seat on passenger side and have her RLE propped up all the way home in the back seat--it may be a little bit tougher to get out  of the car v. the mini-van--however in the mini van not sure they could get the seat to go back far enough to get her RLE in due to she is not able to bend it very much at all.                Cognition   Behavior During Therapy: WFL for tasks assessed/performed Overall Cognitive Status: Within Functional Limits for tasks assessed        Memory: Decreased recall of precautions                            Pertinent Vitals/ Pain       No c/o pain at present         Frequency Min 3X/week     Progress Toward Goals  OT Goals(current goals can now be found in the care plan section)  Progress towards OT goals:  (All education completed.)     Plan Discharge plan remains appropriate       End of Session     Activity Tolerance Patient tolerated treatment well   Patient Left in bed;with call bell/phone within reach;with family/visitor present           Time: 1130-1145 OT Time Calculation (min): 15 min  Charges: OT General Charges $OT Visit: 1 Procedure OT Treatments $Self Care/Home Management : 8-22 mins  Almon Register [phone] 02/11/2014, 12:23 PM

## 2014-02-11 NOTE — Progress Notes (Signed)
PT Cancellation Note  Patient Details Name: Stacy Russell MRN: [id number] DOB: [birth year]   Cancelled Treatment:    Reason Eval/Treat Not Completed: Patient declined, no reason specified Spoke with pt concerning further physical therapy treatment and she reports she does not wish to work with PT at this time. She is worried she will be too tired to safely transfer once she returns home and requests PT see her later this evening to educate her with a final car transfer as she d/c from hospital. Will attempt to see patient depending on d/c time (her husband is traveling from [place] this evening.)  [place], [place]   Ellouise Newer 02/11/2014, 1:29 PM

## 2014-02-11 NOTE — Care Management Note (Signed)
CARE MANAGEMENT NOTE 02/11/2014  Patient:  Kasik,Shruti   Account Number:  [id number]  Date Initiated:  02/08/2014  Documentation initiated by:  Ricki Miller  Subjective/Objective Assessment:   43 yr old female s/p Right tibia IM Nailing     Action/Plan:   PT/OT eval, CM following   Anticipated DC Date:  02/10/2014   Anticipated DC Plan:  [place]  CM consult      [place]   Choice offered to / List presented to:  C-1 Patient   DME arranged  [place]  3-N-1  HOSPITAL BED      DME agency  [place] arranged  [place].   Status of service:  Completed, signed off Medicare Important Message given?   (If response is "NO", the following Medicare IM given date fields will be blank) Date Medicare IM given:   Date Additional Medicare IM given:    Discharge Disposition:  [place]  Per UR Regulation:    If discussed at Long Length of Stay Meetings, dates discussed:    Comments:  02/09/14 0945 Ricki Miller, RN BSN Case Manager [phone] Case manager spoke with patient concerning home health and DME needs at discharge. Choice offered. Referral called to [place], [facility]. Patient states she has family support at discharge.

## 2014-02-11 NOTE — Progress Notes (Signed)
Patient ID: Stacy Russell, female   DOB: 1971/05/23, 43 y.o.   MRN: [id number] Subjective: 4 Days Post-Op Procedure(s) (LRB): INTRAMEDULLARY (IM) NAIL Right TIBIA (Right) IRRIGATION right lower leg wound (Right)    Patient reports pain as moderate.   Swelling in right leg uncomfortable at times  Objective:   VITALS:   Filed Vitals:   02/11/14 0520  BP: 138/75  Pulse: 91  Temp: 98 F (36.7 C)  Resp: 18    Neurovascular intact Incision: dressing C/D/I  LABS  Recent Labs  02/09/14 0350 02/10/14 0747  HGB 10.8* 10.8*  HCT 33.7* 33.8*  WBC 7.8 7.9  PLT 186 220     Recent Labs  02/09/14 0350 02/10/14 0747  NA 139 139  K 4.0 3.8  BUN 7 6  CREATININE 0.55 0.59  GLUCOSE 95 94    No results found for this basename: LABPT, INR,  in the last 72 hours   Assessment/Plan: 4 Days Post-Op Procedure(s) (LRB): INTRAMEDULLARY (IM) NAIL Right TIBIA (Right) IRRIGATION right lower leg wound (Right)   Advance diet Discharge home with home health

## 2014-02-11 NOTE — Progress Notes (Signed)
Patient provided with discharge instructions and follow up information. Educated on Lovenox injection this morning. [facility] has been set up for both PT/OT.

## 2014-02-11 NOTE — Progress Notes (Signed)
Physical Therapy Treatment and Discharge Patient Details Name: Stacy Russell MRN: [id number] DOB: 1971-05-22 Today's Date: 02/11/2014    History of Present Illness Fell at home missing steps with resulting open injury to right ankle, swelling left ankle. R Tib/fib fx - open. Underwent ORIF 4/27. NWB RLE. Cam boot for LLE.    PT Comments    Pt tolerated treatment well; at her request physical therapy educated and assisted her with transfer into personal vehicle for d/c. Performed safely and she/family has no further questions concerning her mobility. Education and goals completed/adequate for discharge.    Follow Up Recommendations  Home health PT     Equipment Recommendations  3in1 (PT);Wheelchair (measurements PT);Hospital bed;Wheelchair cushion (measurements PT);Rolling walker with 5" wheels    Recommendations for Other Services       Precautions / Restrictions Precautions Precautions: Fall Precaution Comments: L painful ankle Required Braces or Orthoses: Other Brace/Splint (CAM LLE) Restrictions Weight Bearing Restrictions: Yes RLE Weight Bearing: Non weight bearing LLE Weight Bearing: Weight bearing as tolerated    Mobility  Bed Mobility Overal bed mobility: Needs Assistance Bed Mobility: Supine to Sit     Supine to sit: Min assist     General bed mobility comments: Min assist for supine>sit for RLE off of bed.  Educated to use LLE to help support RLE  Transfers Overall transfer level: Needs assistance Equipment used: Rolling walker (2 wheeled) Transfers: Stand Pivot Transfers Sit to Stand: Min guard Stand pivot transfers: Min guard       General transfer comment: Min guard for safety with verbal cues for technique. Performed with RW from bed to w/c, pt able to pivot safely. Educated on safe car transfer from w/c. Pt not to use RW due to lack of space, performed pivot safely and cues for hand placement. Demonstrated by PT prior to pt attempt.    Ambulation/Gait                 Stairs            Wheelchair Mobility    Modified Rankin (Stroke Patients Only)       Balance                                    Cognition Arousal/Alertness: Awake/alert Behavior During Therapy: WFL for tasks assessed/performed Overall Cognitive Status: Within Functional Limits for tasks assessed       Memory: Decreased recall of precautions              Exercises      General Comments General comments (skin integrity, edema, etc.): Pivots with CAM boot nicely, no complaint of pain      Pertinent Vitals/Pain Pt denies pain currently. Nurse administered medication prior to therapy session Pt positioned safely in vehicle for comfort    Home Living                      Prior Function            PT Goals (current goals can now be found in the care plan section) Progress towards PT goals: Goals met/education completed, patient discharged from PT    Frequency       PT Plan Discharge plan needs to be updated    Co-evaluation             End of Session Equipment Utilized During Treatment: Other (comment) (CAM  boot LLE) Activity Tolerance: Patient tolerated treatment well Patient left: Other (comment) (In personal vehicle for d/c home)     Time: 1535-1550 PT Time Calculation (min): 15 min  Charges:  $Therapeutic Activity: 8-22 mins                    G CodesCamille Bal Payette, [place]  Ellouise Newer 02/11/2014, 4:13 PM

## 2014-02-11 NOTE — Progress Notes (Signed)
Patient being discharged in private vehicle with husband.

## 2014-02-11 NOTE — Discharge Instructions (Signed)
Cast or Splint Care Casts and splints support injured limbs and keep bones from moving while they heal. It is important to care for your cast or splint at home.  HOME CARE INSTRUCTIONS  Keep the cast or splint uncovered during the drying period. It can take 24 to 48 hours to dry if it is made of plaster. A fiberglass cast will dry in less than 1 hour.  Do not rest the cast on anything harder than a pillow for the first 24 hours.  Do not put weight on your injured limb or apply pressure to the cast until your health care provider gives you permission.  Keep the cast or splint dry. Wet casts or splints can lose their shape and may not support the limb as well. A wet cast that has lost its shape can also create harmful pressure on your skin when it dries. Also, wet skin can become infected.  Cover the cast or splint with a plastic bag when bathing or when out in the rain or snow. If the cast is on the trunk of the body, take sponge baths until the cast is removed.  If your cast does become wet, dry it with a towel or a blow dryer on the cool setting only.  Keep your cast or splint clean. Soiled casts may be wiped with a moistened cloth.  Do not place any hard or soft foreign objects under your cast or splint, such as cotton, toilet paper, lotion, or powder.  Do not try to scratch the skin under the cast with any object. The object could get stuck inside the cast. Also, scratching could lead to an infection. If itching is a problem, use a blow dryer on a cool setting to relieve discomfort.  Do not trim or cut your cast or remove padding from inside of it.  Exercise all joints next to the injury that are not immobilized by the cast or splint. For example, if you have a long leg cast, exercise the hip joint and toes. If you have an arm cast or splint, exercise the shoulder, elbow, thumb, and fingers.  Elevate your injured arm or leg on 1 or 2 pillows for the first 1 to 3 days to decrease  swelling and pain.It is best if you can comfortably elevate your cast so it is higher than your heart. SEEK MEDICAL CARE IF:   Your cast or splint cracks.  Your cast or splint is too tight or too loose.  You have unbearable itching inside the cast.  Your cast becomes wet or develops a soft spot or area.  You have a bad smell coming from inside your cast.  You get an object stuck under your cast.  Your skin around the cast becomes red or raw.  You have new pain or worsening pain after the cast has been applied. SEEK IMMEDIATE MEDICAL CARE IF:   You have fluid leaking through the cast.  You are unable to move your fingers or toes.  You have discolored (blue or white), cool, painful, or very swollen fingers or toes beyond the cast.  You have tingling or numbness around the injured area.  You have severe pain or pressure under the cast.  You have any difficulty with your breathing or have shortness of breath.  You have chest pain. Document Released: 09/27/2000 Document Revised: 07/21/2013 Document Reviewed: 04/08/2013 [facility] Patient Information 2014 [place].  Non weight bearing right lower extremity until further directed Keep wounds dry Call with  questions

## 2014-02-11 NOTE — Progress Notes (Signed)
Patient and husband at bedside educated on Lovenox injections. Husband demonstrated through teach back understanding of skill. No further questions at this time regarding administration.

## 2014-02-14 ENCOUNTER — Telehealth: Payer: Self-pay | Admitting: Family Medicine

## 2014-02-14 NOTE — Telephone Encounter (Signed)
Notified pt sorry about the injury, also sorry having significant pain. Sounds like a lot of pain med to me, she will have to discuss this PT recommendation with the medical group who is managing her fx and prescribing the pain med. As far as breathing and heart issues at night, we will need to see her face to face for eval if she'd like. Patient verbalized understanding.

## 2014-02-14 NOTE — Telephone Encounter (Incomplete)
Tell pt I am sorry about the injury, I'm also sorry having significant pain. Let her know that sounds like a lot of pain med to me, she will have to discuss this PT recimmendation with the medical group who is managing her fx and prescribing the pain med. As far as breathing and heart issues at night, we will need to see her face to face for eval if she'd like.

## 2014-02-14 NOTE — Telephone Encounter (Signed)
Patient is on oxycodone and robaxin due to 2 broke bones in her leg. She is having rapid heart beat and shallow breathing at night. She said currently she is taking her oxycodone 3 pills every 4 hours, but her PT recommended taking 1 every 2 hours and she wanted to run this by you and see what you think she should do.

## 2014-02-15 ENCOUNTER — Encounter (HOSPITAL_COMMUNITY): Payer: Self-pay | Admitting: Emergency Medicine

## 2014-02-15 ENCOUNTER — Emergency Department (HOSPITAL_COMMUNITY)
Admission: EM | Admit: 2014-02-15 | Discharge: 2014-02-15 | Disposition: A | Discharge status: Home / Self Care | Payer: BC Managed Care – PPO | Attending: Emergency Medicine | Admitting: Emergency Medicine

## 2014-02-15 ENCOUNTER — Emergency Department (HOSPITAL_COMMUNITY): Payer: BC Managed Care – PPO

## 2014-02-15 DIAGNOSIS — R0600 Dyspnea, unspecified: Secondary | ICD-10-CM

## 2014-02-15 DIAGNOSIS — Z8639 Personal history of other endocrine, nutritional and metabolic disease: Secondary | ICD-10-CM | POA: Insufficient documentation

## 2014-02-15 DIAGNOSIS — I10 Essential (primary) hypertension: Secondary | ICD-10-CM

## 2014-02-15 DIAGNOSIS — Z792 Long term (current) use of antibiotics: Secondary | ICD-10-CM | POA: Insufficient documentation

## 2014-02-15 DIAGNOSIS — R0989 Other specified symptoms and signs involving the circulatory and respiratory systems: Principal | ICD-10-CM | POA: Insufficient documentation

## 2014-02-15 DIAGNOSIS — Z8679 Personal history of other diseases of the circulatory system: Secondary | ICD-10-CM | POA: Insufficient documentation

## 2014-02-15 DIAGNOSIS — Z862 Personal history of diseases of the blood and blood-forming organs and certain disorders involving the immune mechanism: Secondary | ICD-10-CM | POA: Insufficient documentation

## 2014-02-15 DIAGNOSIS — K219 Gastro-esophageal reflux disease without esophagitis: Secondary | ICD-10-CM | POA: Insufficient documentation

## 2014-02-15 DIAGNOSIS — Z7901 Long term (current) use of anticoagulants: Secondary | ICD-10-CM | POA: Insufficient documentation

## 2014-02-15 DIAGNOSIS — R000 Tachycardia, unspecified: Secondary | ICD-10-CM

## 2014-02-15 DIAGNOSIS — R0609 Other forms of dyspnea: Secondary | ICD-10-CM | POA: Insufficient documentation

## 2014-02-15 DIAGNOSIS — Z79899 Other long term (current) drug therapy: Secondary | ICD-10-CM | POA: Insufficient documentation

## 2014-02-15 LAB — URINALYSIS, ROUTINE W REFLEX MICROSCOPIC
Bilirubin Urine: NEGATIVE
Glucose, UA: NEGATIVE mg/dL
Hgb urine dipstick: NEGATIVE
KETONES UR: NEGATIVE mg/dL
LEUKOCYTES UA: NEGATIVE
Nitrite: NEGATIVE
PROTEIN: NEGATIVE mg/dL
Specific Gravity, Urine: 1.01 (ref 1.005–1.030)
UROBILINOGEN UA: 0.2 mg/dL (ref 0.0–1.0)
pH: 6 (ref 5.0–8.0)

## 2014-02-15 LAB — COMPREHENSIVE METABOLIC PANEL
ALBUMIN: 3.5 g/dL (ref 3.5–5.2)
ALT: 35 U/L (ref 0–35)
AST: 34 U/L (ref 0–37)
Alkaline Phosphatase: 84 U/L (ref 39–117)
BILIRUBIN TOTAL: 0.5 mg/dL (ref 0.3–1.2)
BUN: 11 mg/dL (ref 6–23)
CALCIUM: 10.1 mg/dL (ref 8.4–10.5)
CO2: 30 mEq/L (ref 19–32)
CREATININE: 0.6 mg/dL (ref 0.50–1.10)
Chloride: 101 mEq/L (ref 96–112)
GFR calc Af Amer: >90 mL/min (ref >90)
GFR calc non Af Amer: >90 mL/min (ref >90)
Glucose, Bld: 109 mg/dL — ABNORMAL HIGH (ref 70–99)
Potassium: 4.2 mEq/L (ref 3.7–5.3)
Sodium: 142 mEq/L (ref 137–147)
TOTAL PROTEIN: 7.7 g/dL (ref 6.0–8.3)

## 2014-02-15 LAB — TROPONIN I

## 2014-02-15 LAB — CBC WITH DIFFERENTIAL/PLATELET
Basophils Absolute: 0 10*3/uL (ref 0.0–0.1)
Basophils Relative: 0 % (ref 0–1)
EOS PCT: 3 % (ref 0–5)
Eosinophils Absolute: 0.3 10*3/uL (ref 0.0–0.7)
HEMATOCRIT: 35.1 % — AB (ref 36.0–46.0)
Hemoglobin: 11.6 g/dL — ABNORMAL LOW (ref 12.0–15.0)
Lymphocytes Relative: 27 % (ref 12–46)
Lymphs Abs: 2.6 10*3/uL (ref 0.7–4.0)
MCH: 29.8 pg (ref 26.0–34.0)
MCHC: 33 g/dL (ref 30.0–36.0)
MCV: 90.2 fL (ref 78.0–100.0)
MONO ABS: 0.4 10*3/uL (ref 0.1–1.0)
MONOS PCT: 4 % (ref 3–12)
Neutro Abs: 6.4 10*3/uL (ref 1.7–7.7)
Neutrophils Relative %: 66 % (ref 43–77)
Platelets: 431 10*3/uL — ABNORMAL HIGH (ref 150–400)
RBC: 3.89 MIL/uL (ref 3.87–5.11)
RDW: 13.1 % (ref 11.5–15.5)
WBC: 9.8 10*3/uL (ref 4.0–10.5)

## 2014-02-15 LAB — APTT: APTT: 32 s (ref 24–37)

## 2014-02-15 LAB — PROTIME-INR
INR: 1.03 (ref 0.00–1.49)
PROTHROMBIN TIME: 13.3 s (ref 11.6–15.2)

## 2014-02-15 MED ORDER — IOHEXOL 350 MG/ML SOLN
80.0000 mL | Freq: Once | INTRAVENOUS | Status: AC | PRN
  Administered 2014-02-15: 80 mL via INTRAVENOUS

## 2014-02-15 MED ORDER — SODIUM CHLORIDE 0.9 % IV SOLN
INTRAVENOUS | Status: DC
  Administered 2014-02-15: 14:00:00 via INTRAVENOUS

## 2014-02-15 MED ORDER — SODIUM CHLORIDE 0.9 % IV BOLUS (SEPSIS)
1000.0000 mL | Freq: Once | INTRAVENOUS | Status: AC
  Administered 2014-02-15: 1000 mL via INTRAVENOUS

## 2014-02-15 MED ORDER — DIPHENHYDRAMINE HCL 50 MG/ML IJ SOLN
50.0000 mg | Freq: Once | INTRAMUSCULAR | Status: AC
  Administered 2014-02-15: 50 mg via INTRAVENOUS
  Filled 2014-02-15: qty 1

## 2014-02-15 MED ORDER — IOHEXOL 350 MG/ML SOLN
100.0000 mL | Freq: Once | INTRAVENOUS | Status: AC | PRN
  Administered 2014-02-15: 100 mL via INTRAVENOUS

## 2014-02-15 MED ORDER — ONDANSETRON HCL 4 MG PO TABS: 4.0000 mg | ORAL_TABLET | Freq: Three times a day (TID) | ORAL | Status: DC | PRN

## 2014-02-15 MED ORDER — LORAZEPAM 1 MG PO TABS: 1.0000 mg | ORAL_TABLET | Freq: Three times a day (TID) | ORAL | Status: DC | PRN

## 2014-02-15 MED ORDER — HYDROMORPHONE HCL 2 MG PO TABS: 2.0000 mg | ORAL_TABLET | ORAL | Status: DC | PRN

## 2014-02-15 NOTE — ED Provider Notes (Signed)
CSN: [id number]     Arrival date & time 02/15/14  1141 History   First MD Initiated Contact with Patient 02/15/14 1236     Chief Complaint  Patient presents with  . Shortness of Breath     (Consider location/radiation/quality/duration/timing/severity/associated sxs/prior Treatment) HPI Patient reports on April 27 she fell down some steps and had a compound fracture of her right tibia/fibula and a sprain of her left ankle. She had surgery with a tibial intramedullary nail inserted by Dr.Olin. She reports she was discharged from the hospital 4 days ago. She states she feels like her heart is racing, it happens a lot at night. She states she feels like her face is flushed. She also states she wakes up and feels like she has to catch her breath. She states last night she took Ambien and Phenergan to help her sleep. She states during this episode she also has nausea and vomiting. She states last night she did have some chest pressure although when I asked her she had chest pain she said no. She states she felt lightheaded when she stands. She's had a low-grade temp of 99.3. She denies any coughing. She reports they've been monitoring her blood pressure and it has been elevated. Her systolic pressure has been anywhere from 159 up to 782 and her diastolic has been anywhere from 91 up to 103. She reports it seems to her that after she takes the oxycodone her blood pressure and her pulse go up. She reports she called her surgeon who advised her to see her PCP, when she called her PCP office they could not see her.  PCP Dr Wolfgang Phoenix  Past Medical History  Diagnosis Date  . Hypertension   . Hyperlipidemia   . Venous stasis   . Reflux   . Colitis   . IBS (irritable bowel syndrome)    Past Surgical History  Procedure Laterality Date  . Cholecystectomy    . Abdominal hysterectomy    . Colon surgery    . Femur fracture surgery    . Laproscopic removal of scar tissue    . Appendectomy    . Tibia im nail  insertion Right 02/07/2014    Procedure: INTRAMEDULLARY (IM) NAIL Right TIBIA;  Surgeon: Mauri Pole, MD;  Location: [facility];  Service: Orthopedics;  Laterality: Right;  . Incision and drainage of wound Right 02/07/2014    Procedure: IRRIGATION right lower leg wound;  Surgeon: Mauri Pole, MD;  Location: [facility];  Service: Orthopedics;  Laterality: Right;   Family History  Problem Relation Age of Onset  . Hyperlipidemia Mother   . Hypertension Maternal Aunt   . Hypertension Maternal Uncle   . Hypertension Paternal Aunt   . Hypertension Paternal Uncle   . Cancer Other     breast and colon  . Heart attack Other   . Diabetes Other   . Heart attack Other    History  Substance Use Topics  . Smoking status: Never Smoker   . Smokeless tobacco: Not on file  . Alcohol Use: No   lives at home Lives with spouse Does not work  OB History   Grav Para Term Preterm Abortions TAB SAB Ect Mult Living                 Review of Systems  All other systems reviewed and are negative.     Allergies  Codeine and Dilaudid  Home Medications   Prior to Admission medications   Medication  Sig Start Date End Date Taking? Authorizing Provider  docusate sodium 100 MG CAPS Take 100 mg by mouth 2 (two) times daily. 02/10/14  Yes Benedetto Goad, PA-C  doxycycline (VIBRA-TABS) 100 MG tablet Take 1 tablet (100 mg total) by mouth every 12 (twelve) hours. 02/10/14  Yes Benedetto Goad, PA-C  enoxaparin (LOVENOX) 40 MG/0.4ML injection Inject 0.4 mLs (40 mg total) into the skin daily. 02/11/14 02/25/14 Yes Mauri Pole, MD  lisinopril (PRINIVIL,ZESTRIL) 10 MG tablet Take 10 mg by mouth daily.   Yes Historical Provider, MD  methocarbamol (ROBAXIN) 500 MG tablet Take 1 tablet (500 mg total) by mouth every 6 (six) hours as needed for muscle spasms. 02/10/14  Yes Benedetto Goad, PA-C  omeprazole (PRILOSEC) 20 MG capsule Take 20 mg by mouth daily.   Yes Historical Provider, MD  oxyCODONE (OXY IR/ROXICODONE) 5 MG  immediate release tablet Take 1-3 tablets (5-15 mg total) by mouth every 4 (four) hours as needed for moderate pain or severe pain. 02/10/14  Yes Benedetto Goad, PA-C  promethazine (PHENERGAN) 25 MG tablet Take 25 mg by mouth every 6 (six) hours as needed for nausea or vomiting.   Yes Historical Provider, MD  sertraline (ZOLOFT) 50 MG tablet Take 50 mg by mouth daily.   Yes Historical Provider, MD  zolpidem (AMBIEN) 10 MG tablet Take 10 mg by mouth at bedtime as needed for sleep.   Yes Historical Provider, MD   BP 143/93  Pulse 97  Temp(Src) 98 F (36.7 C) (Oral)  Resp 23  Ht 5\' 10"  (1.778 m)  Wt 240 lb (108.863 kg)  BMI 34.44 kg/m2  SpO2 96%  Vital signs normal   Physical Exam  Nursing note and vitals reviewed. Constitutional: She is oriented to person, place, and time. She appears well-developed and well-nourished.  Non-toxic appearance. She does not appear ill. No distress.  HENT:  Head: Normocephalic and atraumatic.  Right Ear: External ear normal.  Left Ear: External ear normal.  Nose: Nose normal. No mucosal edema or rhinorrhea.  Mouth/Throat: Oropharynx is clear and moist and mucous membranes are normal. No dental abscesses or uvula swelling.  Eyes: Conjunctivae and EOM are normal. Pupils are equal, round, and reactive to light.  Neck: Normal range of motion and full passive range of motion without pain. Neck supple.  Cardiovascular: Normal rate, regular rhythm and normal heart sounds.  Exam reveals no gallop and no friction rub.   No murmur heard. Pulmonary/Chest: Effort normal and breath sounds normal. No respiratory distress. She has no wheezes. She has no rhonchi. She has no rales. She exhibits no tenderness and no crepitus.  Abdominal: Soft. Normal appearance and bowel sounds are normal. She exhibits no distension. There is no tenderness. There is no rebound and no guarding.  Musculoskeletal: Normal range of motion. She exhibits no edema and no tenderness.  Patient has  what appears to be a posterior/stirrup splint on her right lower stream and he below the knee. She has a cam walker on the left  Neurological: She is alert and oriented to person, place, and time. She has normal strength. No cranial nerve deficit.  Skin: Skin is warm, dry and intact. No rash noted. No erythema. No pallor.  Psychiatric: She has a normal mood and affect. Her speech is normal and behavior is normal. Her mood appears not anxious.    ED Course  Procedures (including critical care time)  Medications  0.9 %  sodium chloride infusion ( Intravenous New  Bag/Given 02/15/14 1345)  diphenhydrAMINE (BENADRYL) injection 50 mg (50 mg Intravenous Given 02/15/14 1345)  sodium chloride 0.9 % bolus 1,000 mL (0 mLs Intravenous Stopped 02/15/14 1527)  iohexol (OMNIPAQUE) 350 MG/ML injection 100 mL (100 mLs Intravenous Contrast Given 02/15/14 1444)  iohexol (OMNIPAQUE) 350 MG/ML injection 80 mL (80 mLs Intravenous Contrast Given 02/15/14 1519)    Patient's CT results were discussed with patient and her husband. They are believed that she does not have a PE. However they have noticed that when she takes the oxycodone her blood pressure goes up. She also still has a baseline tachycardia of 103 after 1 L of fluid. He relates she's drinking well. Her blood pressure has also crept up to 157/108. We discussed several possible medication changes. They are wondering if some anxiety may be a component of this. They also wonder if the oxycodone or Robaxin may be causing her tachycardia or hypertension. We have decided to try a few Ativan, try a few dilaudid instead oxycodone to see if that is tolerated better. At this point they're going to monitor her blood pressure. They're going to followup with her PCP about these medication changes and with Dr. Alvan Dame about her pain medication if she is tolerating the dilaudid better. She states at night she sometimes wakes up and feels like she has forgotten to breathe. We are going to  change her Phenergan to Zofran to see if that will stop.States although she has dilaudid as an allergy (itching without rash) she took it in the hospital without benadryl.  Labs Review Results for orders placed during the hospital encounter of 02/15/14  CBC WITH DIFFERENTIAL      Result Value Ref Range   WBC 9.8  4.0 - 10.5 K/uL   RBC 3.89  3.87 - 5.11 MIL/uL   Hemoglobin 11.6 (*) 12.0 - 15.0 g/dL   HCT 35.1 (*) 36.0 - 46.0 %   MCV 90.2  78.0 - 100.0 fL   MCH 29.8  26.0 - 34.0 pg   MCHC 33.0  30.0 - 36.0 g/dL   RDW 13.1  11.5 - 15.5 %   Platelets 431 (*) 150 - 400 K/uL   Neutrophils Relative % 66  43 - 77 %   Neutro Abs 6.4  1.7 - 7.7 K/uL   Lymphocytes Relative 27  12 - 46 %   Lymphs Abs 2.6  0.7 - 4.0 K/uL   Monocytes Relative 4  3 - 12 %   Monocytes Absolute 0.4  0.1 - 1.0 K/uL   Eosinophils Relative 3  0 - 5 %   Eosinophils Absolute 0.3  0.0 - 0.7 K/uL   Basophils Relative 0  0 - 1 %   Basophils Absolute 0.0  0.0 - 0.1 K/uL  COMPREHENSIVE METABOLIC PANEL      Result Value Ref Range   Sodium 142  137 - 147 mEq/L   Potassium 4.2  3.7 - 5.3 mEq/L   Chloride 101  96 - 112 mEq/L   CO2 30  19 - 32 mEq/L   Glucose, Bld 109 (*) 70 - 99 mg/dL   BUN 11  6 - 23 mg/dL   Creatinine, Ser 0.60  0.50 - 1.10 mg/dL   Calcium 10.1  8.4 - 10.5 mg/dL   Total Protein 7.7  6.0 - 8.3 g/dL   Albumin 3.5  3.5 - 5.2 g/dL   AST 34  0 - 37 U/L   ALT 35  0 - 35 U/L   Alkaline  Phosphatase 84  39 - 117 U/L   Total Bilirubin 0.5  0.3 - 1.2 mg/dL   GFR calc non Af Amer >90  >90 mL/min   GFR calc Af Amer >90  >90 mL/min  APTT      Result Value Ref Range   aPTT 32  24 - 37 seconds  PROTIME-INR      Result Value Ref Range   Prothrombin Time 13.3  11.6 - 15.2 seconds   INR 1.03  0.00 - 1.49  TROPONIN I      Result Value Ref Range   Troponin I <0.30  <0.30 ng/mL  URINALYSIS, ROUTINE W REFLEX MICROSCOPIC      Result Value Ref Range   Color, Urine YELLOW  YELLOW   APPearance CLEAR  CLEAR    Specific Gravity, Urine 1.010  1.005 - 1.030   pH 6.0  5.0 - 8.0   Glucose, UA NEGATIVE  NEGATIVE mg/dL   Hgb urine dipstick NEGATIVE  NEGATIVE   Bilirubin Urine NEGATIVE  NEGATIVE   Ketones, ur NEGATIVE  NEGATIVE mg/dL   Protein, ur NEGATIVE  NEGATIVE mg/dL   Urobilinogen, UA 0.2  0.0 - 1.0 mg/dL   Nitrite NEGATIVE  NEGATIVE   Leukocytes, UA NEGATIVE  NEGATIVE    Laboratory interpretation all normal    Imaging Review Ct Angio Chest W/cm &/or Wo Cm  02/15/2014   CLINICAL DATA:  Shortness of breath.  EXAM: CT ANGIOGRAPHY CHEST WITH CONTRAST  TECHNIQUE: Multidetector CT imaging of the chest was performed using the standard protocol during bolus administration of intravenous contrast. Multiplanar CT image reconstructions and MIPs were obtained to evaluate the vascular anatomy.  CONTRAST:  160mL OMNIPAQUE IOHEXOL 350 MG/ML SOLN, 61mL OMNIPAQUE IOHEXOL 350 MG/ML SOLN  COMPARISON:  None.  FINDINGS: The chest wall is unremarkable. No breast masses, supraclavicular or axillary lymphadenopathy. There are small scattered benign-appearing lymph nodes bilaterally. The bony thorax is intact. No destructive bone lesions or spinal canal compromise.  The heart is normal in size. No pericardial effusion. No mediastinal or hilar mass or adenopathy. The aorta is normal in caliber. No dissection. The esophagus is grossly normal.  The pulmonary arterial tree is fairly well opacified. No filling defects to suggest pulmonary emboli.  Examination of the lung parenchyma demonstrates no acute pulmonary findings. No pleural effusion. Minimal dependent bibasilar atelectasis. No worrisome pulmonary lesions. The tracheobronchial tree is unremarkable.  The upper abdomen is unremarkable.  Review of the MIP images confirms the above findings.  IMPRESSION: No CT findings for pulmonary embolism.  Normal thoracic aorta.  No acute pulmonary findings.   Electronically Signed   By: Kalman Jewels M.D.   On: 02/15/2014 15:47   Dg Chest  Portable 1 View  02/15/2014   CLINICAL DATA:  Short of breath  EXAM: PORTABLE CHEST - 1 VIEW  COMPARISON:  None.  FINDINGS: The heart size and mediastinal contours are within normal limits. Both lungs are clear. The visualized skeletal structures are unremarkable.  IMPRESSION: No active disease.   Electronically Signed   By: Franchot Gallo M.D.   On: 02/15/2014 12:43     EKG Interpretation None        Date: 02/15/2014  Rate: 97  Rhythm: normal sinus rhythm  QRS Axis: normal  Intervals: normal  ST/T Wave abnormalities: borderline T wave changes anterior leads  Conduction Disutrbances:none  Narrative Interpretation:   Old EKG Reviewed: none available   MDM   Final diagnoses:  Dyspnea  Tachycardia  Hypertension  New Prescriptions   HYDROMORPHONE (DILAUDID) 2 MG TABLET    Take 1 tablet (2 mg total) by mouth every 4 (four) hours as needed for severe pain.   LORAZEPAM (ATIVAN) 1 MG TABLET    Take 1 tablet (1 mg total) by mouth every 8 (eight) hours as needed for anxiety.   ONDANSETRON (ZOFRAN) 4 MG TABLET    Take 1 tablet (4 mg total) by mouth every 8 (eight) hours as needed for nausea or vomiting.    Plan discharge  Rolland Porter, MD, Alanson Aly, MD 02/15/14 [phone]

## 2014-02-15 NOTE — Discharge Instructions (Signed)
Try the zofran instead of the phenergan for your nausea. Try the dilaudid (hydromorphone) for pain at night to see if you tolerate it better than the oxycodone. If you feel anxious, try the ativan. Continue to monitor your blood pressure and heart rate to see if you are tolerating the new medications better (better blood pressure, better heart rate, feeling less shortness of breath). If the hydromorphone is working better, you can call Dr Aurea Graff nurse to see if they will switch your over to it. If the ativan and zofran are helping, you can call Dr Lance Sell office to see if he will continue you on them. Recheck if you get a fever, struggle to breathe, lips get blue or you feel a lot worse.

## 2014-02-15 NOTE — ED Notes (Addendum)
Pt reports x3 days of headache,sob,lightheadednes,n/v, and high bp. Pt reports low grade fevers as well. Pt alert and oriented. Airway patent. Pt had surgery 02/07/13 for a tibia/fibula fracture.

## 2014-02-15 NOTE — ED Notes (Signed)
At MD's discretion, pt took own oxycodone for right leg pain, reports taking 2 tabs at approx 1300.

## 2014-03-03 ENCOUNTER — Encounter: Payer: Self-pay | Admitting: Family Medicine

## 2014-03-03 ENCOUNTER — Ambulatory Visit (INDEPENDENT_AMBULATORY_CARE_PROVIDER_SITE_OTHER): Payer: BC Managed Care – PPO | Admitting: Family Medicine

## 2014-03-03 VITALS — BP 128/80 | Ht 70.0 in | Wt 240.0 lb

## 2014-03-03 DIAGNOSIS — S82209B Unspecified fracture of shaft of unspecified tibia, initial encounter for open fracture type I or II: Secondary | ICD-10-CM

## 2014-03-03 DIAGNOSIS — S82201B Unspecified fracture of shaft of right tibia, initial encounter for open fracture type I or II: Secondary | ICD-10-CM

## 2014-03-03 DIAGNOSIS — I10 Essential (primary) hypertension: Secondary | ICD-10-CM

## 2014-03-03 DIAGNOSIS — S82401B Unspecified fracture of shaft of right fibula, initial encounter for open fracture type I or II: Secondary | ICD-10-CM

## 2014-03-03 DIAGNOSIS — S82409B Unspecified fracture of shaft of unspecified fibula, initial encounter for open fracture type I or II: Secondary | ICD-10-CM

## 2014-03-03 NOTE — Patient Instructions (Signed)
Call us later when more mobile and we'll set up a bone density test  Take the two calcium/vit D tabs faithfully daily from here on out  Stay on same BP dose  Our long term goal is for most top numbers to be 140 or less, and most bottom numbers to be 90 or less

## 2014-03-03 NOTE — Progress Notes (Signed)
   Subjective:    Patient ID: Stacy Russell, female    DOB: Jun 22, 1971, 43 y.o.   MRN: [id number]  HPI Patient went to the ER on 5/5 for rapid heart beat and shallow breathing at night.  They ran a lot of tests on her, and she said they all came back WNL.  Pt is taking her BP's at home with a monitor, but they forgot to bring the monitor in.   From 5/17 to 5/20 they ranged from 148/103-133/93. Today when I checked it, it was 128/80.  Pt no longer having the symptoms.  Pt would like to talk to you about may be increasing her BP meds.   Pt fell from her front porch steps and broke her right leg, and sprained her left foot.   Wondering if anxiety is causing the symptoms  All emergency room notes are reviewed. Review of Systems    no current chest pain headache back pain abdominal pain no change in bowel habits no blood in stool Objective:   Physical Exam Alert no acute distress. Lungs clear. Heart regular in rhythm. H&T normal. Blood pressure on repeat in the neighborhood of 138/86. Correlation with patient's own blood pressure cuff is reasonable.       Assessment & Plan:  Impression 1 hypertension. #2 recent fracture. #3 dyspnea associated with anxiety discussed. #4 vitamin D and bone concerns discussed. Orthopedist recommended bone density test. Plan 1 initiate bone density test. On further history has not been taking vitamin D and calcium supplement. Advised to take 2 daily from here on out. Maintain same blood pressure medications. Safe ranges discussed. Recheck in 6 months. WSL

## 2014-03-21 ENCOUNTER — Encounter: Payer: Self-pay | Admitting: Family Medicine

## 2014-03-21 ENCOUNTER — Ambulatory Visit (INDEPENDENT_AMBULATORY_CARE_PROVIDER_SITE_OTHER): Payer: BC Managed Care – PPO | Admitting: Family Medicine

## 2014-03-21 VITALS — BP 100/70 | Temp 97.5°F | Ht 70.0 in

## 2014-03-21 DIAGNOSIS — J019 Acute sinusitis, unspecified: Secondary | ICD-10-CM

## 2014-03-21 DIAGNOSIS — J029 Acute pharyngitis, unspecified: Secondary | ICD-10-CM

## 2014-03-21 MED ORDER — CEFPROZIL 500 MG PO TABS: 500.0000 mg | ORAL_TABLET | Freq: Two times a day (BID) | ORAL | Status: DC

## 2014-03-21 NOTE — Progress Notes (Signed)
   Subjective:    Patient ID: Stacy Russell, female    DOB: 11-08-1970, 43 y.o.   MRN: [id number]  Sore Throat  This is a new problem. The current episode started in the past 7 days. The problem has been unchanged. Neither side of throat is experiencing more pain than the other. There has been no fever. The pain is at a severity of 8/10. The pain is moderate. Associated symptoms include congestion, coughing and headaches. Pertinent negatives include no ear pain or shortness of breath. Associated symptoms comments: fatigue. She has tried nothing for the symptoms. The treatment provided no relief.   Patient states that she has no other concerns at this time.  Daughter had illness then pt got it since Friday Felt burning in chest and crackle sound in the lungs   Review of Systems  Constitutional: Negative for fever and activity change.  HENT: Positive for congestion and rhinorrhea. Negative for ear pain.   Eyes: Negative for discharge.  Respiratory: Positive for cough. Negative for shortness of breath and wheezing.   Cardiovascular: Negative for chest pain.  Neurological: Positive for headaches.       Objective:   Physical Exam  Nursing note and vitals reviewed. Constitutional: She appears well-developed.  HENT:  Head: Normocephalic.  Nose: Nose normal.  Mouth/Throat: Oropharynx is clear and moist. No oropharyngeal exudate.  Neck: Neck supple.  Cardiovascular: Normal rate and normal heart sounds.   No murmur heard. Pulmonary/Chest: Effort normal and breath sounds normal. She has no wheezes.  Lymphadenopathy:    She has no cervical adenopathy.  Skin: Skin is warm and dry.          Assessment & Plan:  Pharyngitis with the possibility of acute sinusitis antibiotics discussed and ordered. If progressive troubles or worse followup. Warning signs discussed.  Patient has fractured leg will be getting physical therapy once she gets over the fracture. Under the care of orthopedics.  Currently using oxycodone for pain will gradually taper way to try to avoid long-term use.

## 2014-05-04 ENCOUNTER — Other Ambulatory Visit: Payer: Self-pay

## 2014-05-04 DIAGNOSIS — Z1231 Encounter for screening mammogram for malignant neoplasm of breast: Secondary | ICD-10-CM

## 2014-06-16 ENCOUNTER — Ambulatory Visit: Payer: BC Managed Care – PPO

## 2014-07-05 ENCOUNTER — Ambulatory Visit: Payer: BC Managed Care – PPO

## 2014-08-04 ENCOUNTER — Other Ambulatory Visit (HOSPITAL_COMMUNITY): Payer: Self-pay | Admitting: Obstetrics and Gynecology

## 2014-08-04 DIAGNOSIS — Z780 Asymptomatic menopausal state: Secondary | ICD-10-CM

## 2014-08-08 ENCOUNTER — Telehealth (HOSPITAL_COMMUNITY): Payer: Self-pay | Admitting: Radiology

## 2014-08-09 ENCOUNTER — Ambulatory Visit (HOSPITAL_COMMUNITY)
Admission: RE | Admit: 2014-08-09 | Discharge: 2014-08-09 | Disposition: A | Discharge status: Home / Self Care | Payer: BC Managed Care – PPO | Source: Ambulatory Visit | Attending: Obstetrics and Gynecology | Admitting: Obstetrics and Gynecology

## 2014-08-09 ENCOUNTER — Encounter (HOSPITAL_COMMUNITY): Payer: Self-pay | Admitting: Radiology

## 2014-08-09 ENCOUNTER — Other Ambulatory Visit (HOSPITAL_COMMUNITY): Payer: Self-pay | Admitting: Obstetrics and Gynecology

## 2014-08-09 ENCOUNTER — Ambulatory Visit (HOSPITAL_COMMUNITY): Payer: Self-pay

## 2014-08-09 DIAGNOSIS — Z780 Asymptomatic menopausal state: Secondary | ICD-10-CM | POA: Diagnosis present

## 2014-09-13 ENCOUNTER — Other Ambulatory Visit: Payer: Self-pay | Admitting: Family Medicine

## 2014-09-21 ENCOUNTER — Ambulatory Visit (INDEPENDENT_AMBULATORY_CARE_PROVIDER_SITE_OTHER): Payer: BC Managed Care – PPO | Admitting: Orthopedic Surgery

## 2014-09-21 ENCOUNTER — Encounter: Payer: Self-pay | Admitting: Orthopedic Surgery

## 2014-09-21 VITALS — HR 72 | Ht 70.0 in | Wt 231.0 lb

## 2014-09-21 DIAGNOSIS — M25561 Pain in right knee: Secondary | ICD-10-CM

## 2014-09-21 DIAGNOSIS — S82201S Unspecified fracture of shaft of right tibia, sequela: Secondary | ICD-10-CM

## 2014-09-21 NOTE — Patient Instructions (Signed)
Try cream for 6 weeks call to schedule injection if cream not helpful

## 2014-09-21 NOTE — Progress Notes (Signed)
Patient ID: Stacy Russell, female   DOB: 1971-09-22, 43 y.o.   MRN: [id number] Patient ID: Stacy Russell, female   DOB: 1971-09-26, 43 y.o.   MRN: [id number]  Chief Complaint  Patient presents with  . Knee Problem    2nd opinion, RIGHT LEG AND KNEE PAIN S/P ORIF, open right distal tibia and fibula fracture, DOS 02/07/14,     HPI Stacy Russell is a 43 y.o. female.  HPI Comments: 43 year old female mom of 2 adoptive children had an unknown fortunate accident in April 2015 on the 28th to be exact. She fell down her front steps and unfortunately sustained an open distal tibia fracture. She notes that the fracture occurred about 10:00 in the morning she eventually went to surgery in [place] her choosing and underwent a long procedure to stabilize the fracture and irrigated and debrided by Dr. Paralee Cancel of [facility] orthopedics. She is here for second opinion wondering if her progress is slow course this to be expected.  She has several complaints #1 she has pain in the front of her knee and pain when she extends the knee from 30 to 0. She does have some tingling at the incision when she touches it but no constant tingling. Her pain is described as throbbing and although she wrote 7-8 out of 10 upon further questioning it's probably more in the 4-6 range. She does climb stairs sequentially but feels some discomfort when doing that.  The main thing that bothers her is that she can't squat and it interferes with some of the things she needs to do around the home and do with her kids. She has a lot of trouble getting back up from a squatting position. Most of her pain is at the bottom of her incision.  Review of systems are related to the musculoskeletal joint which include back pain stiff joints gait problem and joint pain the tingling is noted around the incision when she touches it but not constant or uncomfortable. All other systems were normal     Past Medical History  Diagnosis Date  .  Hypertension   . Hyperlipidemia   . Venous stasis   . Reflux   . Colitis   . IBS (irritable bowel syndrome)     Past Surgical History  Procedure Laterality Date  . Cholecystectomy    . Abdominal hysterectomy    . Colon surgery    . Femur fracture surgery    . Laproscopic removal of scar tissue    . Appendectomy    . Tibia im nail insertion Right 02/07/2014    Procedure: INTRAMEDULLARY (IM) NAIL Right TIBIA;  Surgeon: Mauri Pole, MD;  Location: [place];  Service: Orthopedics;  Laterality: Right;  . Incision and drainage of wound Right 02/07/2014    Procedure: IRRIGATION right lower leg wound;  Surgeon: Mauri Pole, MD;  Location: [place];  Service: Orthopedics;  Laterality: Right;    Family History  Problem Relation Age of Onset  . Hyperlipidemia Mother   . Hypertension Maternal Aunt   . Hypertension Maternal Uncle   . Hypertension Paternal Aunt   . Hypertension Paternal Uncle   . Cancer Other     breast and colon  . Heart attack Other   . Diabetes Other   . Heart attack Other     Social History History  Substance Use Topics  . Smoking status: Never Smoker   . Smokeless tobacco: Not on file  . Alcohol Use: No    Allergies  Allergen Reactions  . Codeine Itching  . Dilaudid [Hydromorphone Hcl] Itching and Rash    Current Outpatient Prescriptions  Medication Sig Dispense Refill  . aspirin 325 MG tablet Take 325 mg by mouth daily.    . cefPROZIL (CEFZIL) 500 MG tablet Take 1 tablet (500 mg total) by mouth 2 (two) times daily. (Patient not taking: Reported on 09/21/2014) 20 tablet 0  . docusate sodium 100 MG CAPS Take 100 mg by mouth 2 (two) times daily. (Patient not taking: Reported on 09/21/2014) 30 capsule 0  . lisinopril (PRINIVIL,ZESTRIL) 10 MG tablet TAKE ONE TABLET BY MOUTH ONCE DAILY. 90 tablet 0  . omeprazole (PRILOSEC) 20 MG capsule Take 20 mg by mouth daily.    Marland Kitchen oxyCODONE (OXY IR/ROXICODONE) 5 MG immediate release tablet Take 1-3 tablets (5-15 mg total)  by mouth every 4 (four) hours as needed for moderate pain or severe pain. (Patient not taking: Reported on 09/21/2014) 80 tablet 0  . sertraline (ZOLOFT) 50 MG tablet Take 50 mg by mouth daily.    . traMADol (ULTRAM) 50 MG tablet Take 50 mg by mouth 2 (two) times daily.    Marland Kitchen zolpidem (AMBIEN) 10 MG tablet Take 10 mg by mouth at bedtime as needed for sleep.     No current facility-administered medications for this visit.    Review of Systems Review of Systems  Pulse 72, height 5\' 10"  (1.778 m), weight 231 lb (104.781 kg).  Physical Exam Physical Exam  The patient is well-groomed alert awake and oriented 3 mood and affect are normal she is pleasant she does walk with a slight limp which appears to be from a leg length discrepancy  She is tender at the bottom of her incision her incision healed well she is also tender over the puncture wounds for the screws near the heads of the screws but this is not very significant. She has full range of motion of her knee passively and has a bout attend a 15 difference right to left actively. Her knee is very stable as is her ankle and her ankle range of motion matches the opposite side as far as her motor exam goes she has normal plantar flexion dorsiflexion strength she has excellent straight leg raise and no motor deficit that can be detected. Skin as stated pulses good sensation normal except when you palpate over her distal incision which is approximately 6 cm long  When you watch her walk she limps and it seems that she is limping from leg length discrepancy as described.   Data Reviewed Multiple images are reviewed one set of images is on a printed C-arm series of 4 dated 02/07/2014 and show a proximal and distal locked intramedullary tibial nail with no hardware complications are 2 wires around the fracture which is noted in the patient's operative report.  Operative report also reviewed. Note difficult fracture difficult reduction difficult to  maintain reduction.  Assessment    I discussed the following with the patient regarding her condition related to her open grade 2 right tibia fracture which was treated with irrigation debridement antibiotics and intramedullary nailing locked proximally and distally. It should be noted this fracture was a distal third fracture with comminution.  A #1 goal was to get a stable construct to allow adequate bone healing and prevent infection and this was reached.  The #1 complication or complaint after this surgery is knee pain and the patient does have that 8 months out and this is not unusual. She  appears to have an aggressive patellar tendinitis. She has been treated with diclofenac and knee exercises. However she still continues to have trouble with squatting bending kneeling and getting up from a squatted position as well as some discomfort when climbing the steps. She has a noticeable limp which may have been exacerbated by the fracture but was present and noticed by her mother when she ambulated. This may be related to her femur fracture at age of 33. She did try heel lifts to balance her out but this exacerbated underlying lumbar spine complaints. She does notice prior to the surgery that she had different lengths on her legs when her pants would be pants were hemmed.  The imaging studies show that the fracture healed with acceptable alignment in all planes. She has regained range of motion in her knee and excellent quadriceps strength without extensor lag so she is one on that level as well. She has excellent motion in her ankle joint.  So at this point we are dealing with post op anterior knee pain after tibial nail  The care was excellent as I told the patient the #1 thing was to get this fracture to heal without infection and she has fortunately obtained excellent care from Dr. Alvan Dame with good result and expected anterior knee pain.    Plan    Recommend a topical preparation of lidocaine  menthol and diclofenac for 6 weeks if no improvement recommend injection.       Arther Abbott 09/21/2014, 12:06 PM

## 2014-12-13 ENCOUNTER — Telehealth: Payer: Self-pay | Admitting: *Deleted

## 2014-12-13 NOTE — Telephone Encounter (Signed)
Pt called and said that she woke up Sunday morning with vomiting and diarrhea.   Also, pt has been having LLQ abdominal pain on and off for the past 6 months.   Yesterday and today, the pain is sharp and worsening. She has also passed 2 blood clots (the size of a golf ball) from her rectum.   Per Dr. Richardson Landry, pt needs to go to the ER. Pt wanted to be put on the schedule for tomorrow, but with those s/s she needs to go to ER. I told her that we cannot put her on the schedule for tomorrow b/c that will look as if we are OK with those s/s and that she could wait to be seen tomorrow, which is not OK.  Pt notified and verbalized understanding.

## 2014-12-19 ENCOUNTER — Encounter (INDEPENDENT_AMBULATORY_CARE_PROVIDER_SITE_OTHER): Payer: Self-pay | Admitting: *Deleted

## 2014-12-28 ENCOUNTER — Ambulatory Visit (INDEPENDENT_AMBULATORY_CARE_PROVIDER_SITE_OTHER): Payer: Self-pay | Admitting: Internal Medicine

## 2015-01-05 ENCOUNTER — Telehealth: Payer: Self-pay | Admitting: Family Medicine

## 2015-01-05 NOTE — Telephone Encounter (Signed)
Do ref plz

## 2015-01-05 NOTE — Telephone Encounter (Signed)
Pt called requesting referral to Dr. Gala Romney.  States she was seen recently by Urgent Care here in [place] (states she called here & Dr. Richardson Landry wouldn't see her and recommended ER - see 12/13/14 phone message - but pt chose to see Urgent Care) and he treated her with Cipro & Flagyl.  He recommended GI referral for diverticulitis V/S ulcerative colitis.  Pt states her hemoglobin was fine at Urgent Care but she has had some rectal bleeding   Pt states she called & Dr. Roseanne Kaufman office explained that they need a referral from her PCP  If "ok" to refer, please initiate in system so that I may process (explained to pt that Dr. Richardson Landry could possibly recommend OV here, pt verbalized understanding)   Please advise

## 2015-01-09 NOTE — Telephone Encounter (Signed)
Done

## 2015-01-16 ENCOUNTER — Encounter: Payer: Self-pay | Admitting: Gastroenterology

## 2015-02-03 ENCOUNTER — Ambulatory Visit: Payer: Self-pay | Admitting: Gastroenterology

## 2015-03-16 ENCOUNTER — Other Ambulatory Visit: Payer: Self-pay

## 2015-03-16 DIAGNOSIS — R1032 Left lower quadrant pain: Secondary | ICD-10-CM

## 2015-03-16 DIAGNOSIS — K562 Volvulus: Secondary | ICD-10-CM

## 2015-03-17 ENCOUNTER — Other Ambulatory Visit: Payer: Self-pay

## 2015-03-17 DIAGNOSIS — K562 Volvulus: Secondary | ICD-10-CM

## 2015-03-17 DIAGNOSIS — R1032 Left lower quadrant pain: Secondary | ICD-10-CM

## 2015-03-17 NOTE — Addendum Note (Signed)
Addended by: Excell Seltzer T on: 03/17/2015 01:01 PM   Modules accepted: Orders

## 2015-03-20 ENCOUNTER — Other Ambulatory Visit: Payer: Self-pay

## 2015-03-20 ENCOUNTER — Other Ambulatory Visit: Payer: Self-pay | Admitting: *Deleted

## 2015-03-20 DIAGNOSIS — K562 Volvulus: Secondary | ICD-10-CM

## 2015-03-20 NOTE — Addendum Note (Signed)
Addended by: Excell Seltzer T on: 03/20/2015 10:53 AM   Modules accepted: Orders

## 2015-03-22 ENCOUNTER — Ambulatory Visit (HOSPITAL_COMMUNITY)
Admission: RE | Admit: 2015-03-22 | Discharge: 2015-03-22 | Disposition: A | Discharge status: Home / Self Care | Payer: BLUE CROSS/BLUE SHIELD | Source: Ambulatory Visit | Attending: General Surgery | Admitting: General Surgery

## 2015-03-22 DIAGNOSIS — Z9049 Acquired absence of other specified parts of digestive tract: Secondary | ICD-10-CM | POA: Diagnosis not present

## 2015-03-22 DIAGNOSIS — Z90710 Acquired absence of both cervix and uterus: Secondary | ICD-10-CM | POA: Diagnosis not present

## 2015-03-22 DIAGNOSIS — R1032 Left lower quadrant pain: Secondary | ICD-10-CM | POA: Insufficient documentation

## 2015-03-22 MED ORDER — IOHEXOL 300 MG/ML  SOLN
100.0000 mL | Freq: Once | INTRAMUSCULAR | Status: AC | PRN
  Administered 2015-03-22: 100 mL via INTRAVENOUS

## 2015-05-03 ENCOUNTER — Other Ambulatory Visit: Payer: Self-pay | Admitting: General Surgery

## 2015-05-31 ENCOUNTER — Encounter (HOSPITAL_COMMUNITY)
Admission: RE | Admit: 2015-05-31 | Discharge: 2015-05-31 | Disposition: A | Discharge status: Home / Self Care | Payer: BLUE CROSS/BLUE SHIELD | Source: Ambulatory Visit | Attending: General Surgery | Admitting: General Surgery

## 2015-05-31 ENCOUNTER — Encounter (HOSPITAL_COMMUNITY): Payer: Self-pay

## 2015-05-31 DIAGNOSIS — K562 Volvulus: Secondary | ICD-10-CM | POA: Insufficient documentation

## 2015-05-31 DIAGNOSIS — Z01818 Encounter for other preprocedural examination: Secondary | ICD-10-CM | POA: Diagnosis not present

## 2015-05-31 HISTORY — DX: Other complications of anesthesia, initial encounter: T88.59XA

## 2015-05-31 HISTORY — DX: Adverse effect of unspecified anesthetic, initial encounter: T41.45XA

## 2015-05-31 HISTORY — DX: Pain in right knee: M25.561

## 2015-05-31 HISTORY — DX: Other specified postprocedural states: Z98.890

## 2015-05-31 HISTORY — DX: Cardiac arrhythmia, unspecified: I49.9

## 2015-05-31 HISTORY — DX: Nausea with vomiting, unspecified: R11.2

## 2015-05-31 HISTORY — DX: Gastro-esophageal reflux disease without esophagitis: K21.9

## 2015-05-31 HISTORY — DX: Other specified soft tissue disorders: M79.89

## 2015-05-31 HISTORY — DX: Other specified soft tissue disorders: M79.661

## 2015-05-31 HISTORY — DX: Unspecified fall, initial encounter: W19.XXXA

## 2015-05-31 LAB — ABO/RH: ABO/RH(D): A POS

## 2015-05-31 LAB — CBC
HCT: 40.1 % (ref 36.0–46.0)
Hemoglobin: 13.3 g/dL (ref 12.0–15.0)
MCH: 29.2 pg (ref 26.0–34.0)
MCHC: 33.2 g/dL (ref 30.0–36.0)
MCV: 87.9 fL (ref 78.0–100.0)
Platelets: 251 10*3/uL (ref 150–400)
RBC: 4.56 MIL/uL (ref 3.87–5.11)
RDW: 12.5 % (ref 11.5–15.5)
WBC: 6.2 10*3/uL (ref 4.0–10.5)

## 2015-05-31 LAB — BASIC METABOLIC PANEL
ANION GAP: 9 (ref 5–15)
BUN: 14 mg/dL (ref 6–20)
CO2: 30 mmol/L (ref 22–32)
Calcium: 9.5 mg/dL (ref 8.9–10.3)
Chloride: 101 mmol/L (ref 101–111)
Creatinine, Ser: 0.69 mg/dL (ref 0.44–1.00)
GFR calc Af Amer: >60 mL/min (ref >60)
Glucose, Bld: 93 mg/dL (ref 65–99)
POTASSIUM: 3.2 mmol/L — AB (ref 3.5–5.1)
SODIUM: 140 mmol/L (ref 135–145)

## 2015-05-31 NOTE — Patient Instructions (Signed)
Sonda Brevik  05/31/2015   Your procedure is scheduled on: Monday June 05, 2015   Report to [facility]  Entrance take [place]  elevators to 3rd floor to  [place] at 5:30 AM.  Call this number if you have problems the morning of surgery [phone]   Remember: ONLY 1 PERSON MAY GO WITH YOU TO SHORT STAY TO GET  READY MORNING OF [place].  Do not eat food or drink liquids :After Midnight.                FOLLOW MD INSTRUCTION IN REGARDS TO BOWEL PREPARATION PRIOR TO SURGERY.    Take these medicines the morning of surgery with A SIP OF WATER: NONE                               You may not have any metal on your body including hair pins and              piercings  Do not wear jewelry, make-up, lotions, powders or perfumes, deodorant             Do not wear nail polish.  Do not shave  48 hours prior to surgery.               Do not bring valuables to the hospital. [place].  Contacts, dentures or bridgework may not be worn into surgery.  Leave suitcase in the car. After surgery it may be brought to your room.     _____________________________________________________________________             [facility] - Preparing for Surgery Before surgery, you can play an important role.  Because skin is not sterile, your skin needs to be as free of germs as possible.  You can reduce the number of germs on your skin by washing with CHG (chlorahexidine gluconate) soap before surgery.  CHG is an antiseptic cleaner which kills germs and bonds with the skin to continue killing germs even after washing. Please DO NOT use if you have an allergy to CHG or antibacterial soaps.  If your skin becomes reddened/irritated stop using the CHG and inform your nurse when you arrive at Short Stay. Do not shave (including legs and underarms) for at least 48 hours prior to the first CHG shower.  You may shave your  face/neck. Please follow these instructions carefully:  1.  Shower with CHG Soap the night before surgery and the  morning of Surgery.  2.  If you choose to wash your hair, wash your hair first as usual with your  normal  shampoo.  3.  After you shampoo, rinse your hair and body thoroughly to remove the  shampoo.                           4.  Use CHG as you would any other liquid soap.  You can apply chg directly  to the skin and wash                       Gently with a scrungie or clean washcloth.  5.  Apply the CHG Soap to your body ONLY FROM  THE NECK DOWN.   Do not use on face/ open                           Wound or open sores. Avoid contact with eyes, ears mouth and genitals (private parts).                       Wash face,  Genitals (private parts) with your normal soap.             6.  Wash thoroughly, paying special attention to the area where your surgery  will be performed.  7.  Thoroughly rinse your body with warm water from the neck down.  8.  DO NOT shower/wash with your normal soap after using and rinsing off  the CHG Soap.                9.  Pat yourself dry with a clean towel.            10.  Wear clean pajamas.            11.  Place clean sheets on your bed the night of your first shower and do not  sleep with pets. Day of Surgery : Do not apply any lotions/deodorants the morning of surgery.  Please wear clean clothes to the hospital/surgery center.  FAILURE TO FOLLOW THESE INSTRUCTIONS MAY RESULT IN THE CANCELLATION OF YOUR SURGERY PATIENT SIGNATURE_________________________________  NURSE SIGNATURE__________________________________  ________________________________________________________________________

## 2015-05-31 NOTE — Progress Notes (Signed)
BMP results per epic per PAT visit 05/31/2015 sent to Dr Excell Seltzer

## 2015-06-01 LAB — HEMOGLOBIN A1C
HEMOGLOBIN A1C: 5.9 % — AB (ref 4.8–5.6)
Mean Plasma Glucose: 123 mg/dL

## 2015-06-01 NOTE — Progress Notes (Signed)
HGA1C results per epic per PAT visit 05/31/2015 sent to Dr Excell Seltzer

## 2015-06-05 ENCOUNTER — Inpatient Hospital Stay (HOSPITAL_COMMUNITY)
Admission: RE | Admit: 2015-06-05 | Discharge: 2015-06-08 | DRG: 331 | Disposition: A | Discharge status: Home / Self Care | Payer: BLUE CROSS/BLUE SHIELD | Source: Ambulatory Visit | Attending: General Surgery | Admitting: General Surgery

## 2015-06-05 ENCOUNTER — Encounter (HOSPITAL_COMMUNITY): Payer: Self-pay

## 2015-06-05 ENCOUNTER — Inpatient Hospital Stay (HOSPITAL_COMMUNITY): Payer: BLUE CROSS/BLUE SHIELD | Admitting: Anesthesiology

## 2015-06-05 ENCOUNTER — Encounter (HOSPITAL_COMMUNITY)
Admission: RE | Disposition: A | Discharge status: Home / Self Care | Payer: Self-pay | Source: Ambulatory Visit | Attending: General Surgery

## 2015-06-05 DIAGNOSIS — Z90710 Acquired absence of both cervix and uterus: Secondary | ICD-10-CM

## 2015-06-05 DIAGNOSIS — Z79899 Other long term (current) drug therapy: Secondary | ICD-10-CM

## 2015-06-05 DIAGNOSIS — R1030 Lower abdominal pain, unspecified: Secondary | ICD-10-CM | POA: Diagnosis present

## 2015-06-05 DIAGNOSIS — I10 Essential (primary) hypertension: Secondary | ICD-10-CM | POA: Diagnosis present

## 2015-06-05 DIAGNOSIS — Z01810 Encounter for preprocedural cardiovascular examination: Secondary | ICD-10-CM

## 2015-06-05 DIAGNOSIS — Z01812 Encounter for preprocedural laboratory examination: Secondary | ICD-10-CM

## 2015-06-05 DIAGNOSIS — Z885 Allergy status to narcotic agent status: Secondary | ICD-10-CM | POA: Diagnosis not present

## 2015-06-05 DIAGNOSIS — K562 Volvulus: Secondary | ICD-10-CM | POA: Diagnosis present

## 2015-06-05 DIAGNOSIS — K660 Peritoneal adhesions (postprocedural) (postinfection): Secondary | ICD-10-CM | POA: Diagnosis present

## 2015-06-05 DIAGNOSIS — Z7982 Long term (current) use of aspirin: Secondary | ICD-10-CM

## 2015-06-05 HISTORY — PX: LAPAROSCOPIC SIGMOID COLECTOMY: SHX5928

## 2015-06-05 LAB — TYPE AND SCREEN
ABO/RH(D): A POS
Antibody Screen: NEGATIVE

## 2015-06-05 SURGERY — COLECTOMY, SIGMOID, LAPAROSCOPIC
Anesthesia: General | Site: Abdomen

## 2015-06-05 MED ORDER — LISINOPRIL-HYDROCHLOROTHIAZIDE 20-12.5 MG PO TABS: 1.0000 | ORAL_TABLET | Freq: Every morning | ORAL | Status: DC

## 2015-06-05 MED ORDER — KETOROLAC TROMETHAMINE 30 MG/ML IJ SOLN
INTRAMUSCULAR | Status: AC
Start: 2015-06-05 — End: 2015-06-06
  Filled 2015-06-05: qty 1

## 2015-06-05 MED ORDER — LIDOCAINE HCL (CARDIAC) 20 MG/ML IV SOLN
INTRAVENOUS | Status: AC
  Filled 2015-06-05: qty 5

## 2015-06-05 MED ORDER — ALVIMOPAN 12 MG PO CAPS
12.0000 mg | ORAL_CAPSULE | Freq: Two times a day (BID) | ORAL | Status: DC
  Administered 2015-06-06 – 2015-06-07 (×3): 12 mg via ORAL
  Filled 2015-06-05 (×4): qty 1

## 2015-06-05 MED ORDER — METOCLOPRAMIDE HCL 5 MG/ML IJ SOLN
INTRAMUSCULAR | Status: AC
  Filled 2015-06-05: qty 2

## 2015-06-05 MED ORDER — PROPOFOL 10 MG/ML IV BOLUS
INTRAVENOUS | Status: DC | PRN
  Administered 2015-06-05: 200 mg via INTRAVENOUS

## 2015-06-05 MED ORDER — FENTANYL CITRATE (PF) 100 MCG/2ML IJ SOLN
INTRAMUSCULAR | Status: AC
  Filled 2015-06-05: qty 2

## 2015-06-05 MED ORDER — GLYCOPYRROLATE 0.2 MG/ML IJ SOLN
INTRAMUSCULAR | Status: AC
  Filled 2015-06-05: qty 1

## 2015-06-05 MED ORDER — ROCURONIUM BROMIDE 100 MG/10ML IV SOLN
INTRAVENOUS | Status: AC
  Filled 2015-06-05: qty 1

## 2015-06-05 MED ORDER — ENOXAPARIN SODIUM 40 MG/0.4ML ~~LOC~~ SOLN
40.0000 mg | SUBCUTANEOUS | Status: DC
  Administered 2015-06-06 – 2015-06-08 (×3): 40 mg via SUBCUTANEOUS
  Filled 2015-06-05 (×4): qty 0.4

## 2015-06-05 MED ORDER — HYDROCHLOROTHIAZIDE 12.5 MG PO CAPS
12.5000 mg | ORAL_CAPSULE | Freq: Every day | ORAL | Status: DC
  Administered 2015-06-05 – 2015-06-08 (×2): 12.5 mg via ORAL
  Filled 2015-06-05 (×4): qty 1

## 2015-06-05 MED ORDER — NEOMYCIN SULFATE 500 MG PO TABS
1000.0000 mg | ORAL_TABLET | ORAL | Status: DC
  Filled 2015-06-05: qty 2

## 2015-06-05 MED ORDER — FENTANYL CITRATE (PF) 100 MCG/2ML IJ SOLN
25.0000 ug | INTRAMUSCULAR | Status: DC | PRN
  Administered 2015-06-05 (×3): 50 ug via INTRAVENOUS

## 2015-06-05 MED ORDER — ERYTHROMYCIN BASE 250 MG PO TABS
1000.0000 mg | ORAL_TABLET | ORAL | Status: DC
  Filled 2015-06-05: qty 4

## 2015-06-05 MED ORDER — LACTATED RINGERS IV SOLN: INTRAVENOUS | Status: DC

## 2015-06-05 MED ORDER — CHLORHEXIDINE GLUCONATE 0.12 % MT SOLN
15.0000 mL | Freq: Two times a day (BID) | OROMUCOSAL | Status: DC
  Administered 2015-06-05 – 2015-06-07 (×5): 15 mL via OROMUCOSAL
  Filled 2015-06-05 (×8): qty 15

## 2015-06-05 MED ORDER — LIDOCAINE HCL (CARDIAC) 20 MG/ML IV SOLN
INTRAVENOUS | Status: DC | PRN
  Administered 2015-06-05: 100 mg via INTRAVENOUS

## 2015-06-05 MED ORDER — MIDAZOLAM HCL 2 MG/2ML IJ SOLN
INTRAMUSCULAR | Status: AC
  Filled 2015-06-05: qty 4

## 2015-06-05 MED ORDER — ONDANSETRON HCL 4 MG PO TABS
4.0000 mg | ORAL_TABLET | Freq: Four times a day (QID) | ORAL | Status: DC | PRN
Start: 2015-06-05 — End: 2015-06-08

## 2015-06-05 MED ORDER — LACTATED RINGERS IV SOLN
INTRAVENOUS | Status: DC | PRN
  Administered 2015-06-05 (×4): via INTRAVENOUS

## 2015-06-05 MED ORDER — DEXAMETHASONE SODIUM PHOSPHATE 10 MG/ML IJ SOLN
INTRAMUSCULAR | Status: AC
  Filled 2015-06-05: qty 1

## 2015-06-05 MED ORDER — NEOSTIGMINE METHYLSULFATE 10 MG/10ML IV SOLN
INTRAVENOUS | Status: DC | PRN
  Administered 2015-06-05: 4 mg via INTRAVENOUS

## 2015-06-05 MED ORDER — GLYCOPYRROLATE 0.2 MG/ML IJ SOLN
INTRAMUSCULAR | Status: DC | PRN
  Administered 2015-06-05: 0.6 mg via INTRAVENOUS

## 2015-06-05 MED ORDER — DEXTROSE 5 % IV SOLN
2.0000 g | INTRAVENOUS | Status: AC
  Administered 2015-06-05: 2 g via INTRAVENOUS
  Filled 2015-06-05: qty 2

## 2015-06-05 MED ORDER — KCL IN DEXTROSE-NACL 20-5-0.9 MEQ/L-%-% IV SOLN
INTRAVENOUS | Status: DC
  Administered 2015-06-05: 15:00:00 via INTRAVENOUS
  Administered 2015-06-06: 100 mL/h via INTRAVENOUS
  Administered 2015-06-06 – 2015-06-07 (×3): via INTRAVENOUS
  Filled 2015-06-05 (×8): qty 1000

## 2015-06-05 MED ORDER — FENTANYL CITRATE (PF) 250 MCG/5ML IJ SOLN
INTRAMUSCULAR | Status: AC
  Filled 2015-06-05: qty 25

## 2015-06-05 MED ORDER — OXYCODONE-ACETAMINOPHEN 5-325 MG PO TABS
1.0000 | ORAL_TABLET | ORAL | Status: DC | PRN
  Administered 2015-06-05 – 2015-06-06 (×2): 2 via ORAL
  Filled 2015-06-05 (×2): qty 2

## 2015-06-05 MED ORDER — PROPOFOL 10 MG/ML IV BOLUS
INTRAVENOUS | Status: AC
  Filled 2015-06-05: qty 20

## 2015-06-05 MED ORDER — PEG 3350-KCL-NA BICARB-NACL 420 G PO SOLR
4000.0000 mL | Freq: Once | ORAL | Status: DC
  Filled 2015-06-05: qty 4000

## 2015-06-05 MED ORDER — SODIUM CHLORIDE 0.9 % IJ SOLN
INTRAMUSCULAR | Status: AC
  Filled 2015-06-05: qty 20

## 2015-06-05 MED ORDER — DEXAMETHASONE SODIUM PHOSPHATE 4 MG/ML IJ SOLN
INTRAMUSCULAR | Status: DC | PRN
  Administered 2015-06-05: 10 mg via INTRAVENOUS

## 2015-06-05 MED ORDER — 0.9 % SODIUM CHLORIDE (POUR BTL) OPTIME
TOPICAL | Status: DC | PRN
  Administered 2015-06-05: 2000 mL

## 2015-06-05 MED ORDER — FENTANYL CITRATE (PF) 100 MCG/2ML IJ SOLN
25.0000 ug | INTRAMUSCULAR | Status: AC | PRN
  Administered 2015-06-05 (×6): 25 ug via INTRAVENOUS

## 2015-06-05 MED ORDER — LISINOPRIL 20 MG PO TABS
20.0000 mg | ORAL_TABLET | Freq: Every day | ORAL | Status: DC
  Administered 2015-06-05 – 2015-06-08 (×2): 20 mg via ORAL
  Filled 2015-06-05 (×4): qty 1

## 2015-06-05 MED ORDER — METOCLOPRAMIDE HCL 5 MG/ML IJ SOLN
10.0000 mg | Freq: Once | INTRAMUSCULAR | Status: AC | PRN
  Administered 2015-06-05: 10 mg via INTRAVENOUS

## 2015-06-05 MED ORDER — ONDANSETRON HCL 4 MG/2ML IJ SOLN
INTRAMUSCULAR | Status: AC
  Filled 2015-06-05: qty 2

## 2015-06-05 MED ORDER — PROMETHAZINE HCL 25 MG/ML IJ SOLN
12.5000 mg | Freq: Four times a day (QID) | INTRAMUSCULAR | Status: DC | PRN
  Administered 2015-06-05 (×2): 12.5 mg via INTRAVENOUS
  Filled 2015-06-05 (×2): qty 1

## 2015-06-05 MED ORDER — MEPERIDINE HCL 50 MG/ML IJ SOLN: 6.2500 mg | INTRAMUSCULAR | Status: DC | PRN

## 2015-06-05 MED ORDER — SUCCINYLCHOLINE CHLORIDE 20 MG/ML IJ SOLN
INTRAMUSCULAR | Status: DC | PRN
  Administered 2015-06-05: 100 mg via INTRAVENOUS

## 2015-06-05 MED ORDER — CHLORHEXIDINE GLUCONATE CLOTH 2 % EX PADS: 6.0000 | MEDICATED_PAD | Freq: Once | CUTANEOUS | Status: DC

## 2015-06-05 MED ORDER — SERTRALINE HCL 50 MG PO TABS
50.0000 mg | ORAL_TABLET | Freq: Every day | ORAL | Status: DC
  Administered 2015-06-05 – 2015-06-07 (×3): 50 mg via ORAL
  Filled 2015-06-05 (×4): qty 1

## 2015-06-05 MED ORDER — ONDANSETRON HCL 4 MG/2ML IJ SOLN
4.0000 mg | Freq: Four times a day (QID) | INTRAMUSCULAR | Status: DC | PRN
  Administered 2015-06-05 – 2015-06-07 (×2): 4 mg via INTRAVENOUS
  Filled 2015-06-05 (×2): qty 2

## 2015-06-05 MED ORDER — SODIUM CHLORIDE 0.9 % IJ SOLN
INTRAMUSCULAR | Status: DC | PRN
  Administered 2015-06-05: 20 mL

## 2015-06-05 MED ORDER — BUPIVACAINE-EPINEPHRINE (PF) 0.25% -1:200000 IJ SOLN
INTRAMUSCULAR | Status: DC | PRN
  Administered 2015-06-05: 22 mL

## 2015-06-05 MED ORDER — ALVIMOPAN 12 MG PO CAPS
12.0000 mg | ORAL_CAPSULE | Freq: Once | ORAL | Status: AC
  Administered 2015-06-05: 12 mg via ORAL
  Filled 2015-06-05: qty 1

## 2015-06-05 MED ORDER — FENTANYL CITRATE (PF) 100 MCG/2ML IJ SOLN
INTRAMUSCULAR | Status: AC
Start: 2015-06-05 — End: 2015-06-06
  Filled 2015-06-05: qty 2

## 2015-06-05 MED ORDER — BUPIVACAINE-EPINEPHRINE (PF) 0.25% -1:200000 IJ SOLN
INTRAMUSCULAR | Status: AC
  Filled 2015-06-05: qty 30

## 2015-06-05 MED ORDER — ONDANSETRON HCL 4 MG/2ML IJ SOLN
INTRAMUSCULAR | Status: DC | PRN
  Administered 2015-06-05 (×2): 4 mg via INTRAVENOUS

## 2015-06-05 MED ORDER — LACTATED RINGERS IR SOLN
Status: DC | PRN
  Administered 2015-06-05: 1000 mL

## 2015-06-05 MED ORDER — CETYLPYRIDINIUM CHLORIDE 0.05 % MT LIQD
7.0000 mL | Freq: Two times a day (BID) | OROMUCOSAL | Status: DC
  Administered 2015-06-05 – 2015-06-06 (×3): 7 mL via OROMUCOSAL

## 2015-06-05 MED ORDER — MORPHINE SULFATE (PF) 2 MG/ML IV SOLN
2.0000 mg | INTRAVENOUS | Status: DC | PRN
  Administered 2015-06-05 (×2): 6 mg via INTRAVENOUS
  Administered 2015-06-05: 2 mg via INTRAVENOUS
  Administered 2015-06-05: 6 mg via INTRAVENOUS
  Administered 2015-06-05: 4 mg via INTRAVENOUS
  Administered 2015-06-05: 2 mg via INTRAVENOUS
  Administered 2015-06-05: 4 mg via INTRAVENOUS
  Administered 2015-06-05 – 2015-06-06 (×2): 6 mg via INTRAVENOUS
  Administered 2015-06-06: 4 mg via INTRAVENOUS
  Administered 2015-06-06 (×2): 6 mg via INTRAVENOUS
  Administered 2015-06-06: 4 mg via INTRAVENOUS
  Administered 2015-06-06 – 2015-06-07 (×3): 6 mg via INTRAVENOUS
  Filled 2015-06-05 (×10): qty 3
  Filled 2015-06-05: qty 2
  Filled 2015-06-05 (×2): qty 3
  Filled 2015-06-05: qty 1
  Filled 2015-06-05: qty 3
  Filled 2015-06-05 (×2): qty 2

## 2015-06-05 MED ORDER — KETOROLAC TROMETHAMINE 30 MG/ML IJ SOLN
30.0000 mg | Freq: Once | INTRAMUSCULAR | Status: AC
  Administered 2015-06-05: 30 mg via INTRAVENOUS

## 2015-06-05 MED ORDER — ROCURONIUM BROMIDE 100 MG/10ML IV SOLN
INTRAVENOUS | Status: DC | PRN
  Administered 2015-06-05: 30 mg via INTRAVENOUS
  Administered 2015-06-05 (×3): 10 mg via INTRAVENOUS

## 2015-06-05 MED ORDER — METOPROLOL TARTRATE 1 MG/ML IV SOLN
5.0000 mg | Freq: Four times a day (QID) | INTRAVENOUS | Status: DC | PRN
  Filled 2015-06-05: qty 5

## 2015-06-05 MED ORDER — FENTANYL CITRATE (PF) 100 MCG/2ML IJ SOLN
INTRAMUSCULAR | Status: DC | PRN
  Administered 2015-06-05: 50 ug via INTRAVENOUS
  Administered 2015-06-05 (×11): 25 ug via INTRAVENOUS
  Administered 2015-06-05: 50 ug via INTRAVENOUS
  Administered 2015-06-05: 25 ug via INTRAVENOUS

## 2015-06-05 MED ORDER — BUPIVACAINE LIPOSOME 1.3 % IJ SUSP
20.0000 mL | Freq: Once | INTRAMUSCULAR | Status: AC
  Administered 2015-06-05: 20 mL
  Filled 2015-06-05: qty 20

## 2015-06-05 MED ORDER — MIDAZOLAM HCL 5 MG/5ML IJ SOLN
INTRAMUSCULAR | Status: DC | PRN
  Administered 2015-06-05 (×2): 1 mg via INTRAVENOUS

## 2015-06-05 MED ORDER — NEOSTIGMINE METHYLSULFATE 10 MG/10ML IV SOLN
INTRAVENOUS | Status: AC
  Filled 2015-06-05: qty 1

## 2015-06-05 SURGICAL SUPPLY — 82 items
APPLIER CLIP 5 13 M/L LIGAMAX5 (MISCELLANEOUS)
APPLIER CLIP ROT 10 11.4 M/L (STAPLE)
BLADE HEX COATED 2.75 (ELECTRODE) ×2 IMPLANT
CELLS DAT CNTRL 66122 CELL SVR (MISCELLANEOUS) IMPLANT
CHLORAPREP W/TINT 26ML (MISCELLANEOUS) IMPLANT
CLIP APPLIE 5 13 M/L LIGAMAX5 (MISCELLANEOUS) IMPLANT
CLIP APPLIE ROT 10 11.4 M/L (STAPLE) IMPLANT
COVER SURGICAL LIGHT HANDLE (MISCELLANEOUS) ×4 IMPLANT
CUTTER FLEX LINEAR 45M (STAPLE) IMPLANT
DRAPE CAMERA CLOSED 9X96 (DRAPES) IMPLANT
DRAPE LAPAROSCOPIC ABDOMINAL (DRAPES) ×2 IMPLANT
DRAPE SURG IRRIG POUCH 19X23 (DRAPES) ×2 IMPLANT
DRSG OPSITE POSTOP 4X6 (GAUZE/BANDAGES/DRESSINGS) ×2 IMPLANT
ELECT REM PT RETURN 9FT ADLT (ELECTROSURGICAL) ×2
ELECTRODE REM PT RTRN 9FT ADLT (ELECTROSURGICAL) ×1 IMPLANT
ENDOLOOP SUT PDS II  0 18 (SUTURE)
ENDOLOOP SUT PDS II 0 18 (SUTURE) IMPLANT
GAUZE SPONGE 4X4 12PLY STRL (GAUZE/BANDAGES/DRESSINGS) IMPLANT
GLOVE BIOGEL PI IND STRL 7.0 (GLOVE) ×3 IMPLANT
GLOVE BIOGEL PI IND STRL 7.5 (GLOVE) ×2 IMPLANT
GLOVE BIOGEL PI IND STRL 8 (GLOVE) ×2 IMPLANT
GLOVE BIOGEL PI INDICATOR 7.0 (GLOVE) ×3
GLOVE BIOGEL PI INDICATOR 7.5 (GLOVE) ×2
GLOVE BIOGEL PI INDICATOR 8 (GLOVE) ×2
GLOVE ECLIPSE 6.5 STRL STRAW (GLOVE) ×6 IMPLANT
GLOVE ECLIPSE 7.5 STRL STRAW (GLOVE) ×4 IMPLANT
GLOVE ECLIPSE 8.0 STRL XLNG CF (GLOVE) ×4 IMPLANT
GLOVE SURG SS PI 6.5 STRL IVOR (GLOVE) ×8 IMPLANT
GOWN STRL REUS W/TWL LRG LVL3 (GOWN DISPOSABLE) ×2 IMPLANT
GOWN STRL REUS W/TWL XL LVL3 (GOWN DISPOSABLE) ×20 IMPLANT
HOLDER FOLEY CATH W/STRAP (MISCELLANEOUS) ×2 IMPLANT
LEGGING LITHOTOMY PAIR STRL (DRAPES) ×2 IMPLANT
LIGASURE IMPACT 36 18CM CVD LR (INSTRUMENTS) IMPLANT
LIQUID BAND (GAUZE/BANDAGES/DRESSINGS) IMPLANT
NS IRRIG 1000ML POUR BTL (IV SOLUTION) ×4 IMPLANT
PACK COLON (CUSTOM PROCEDURE TRAY) ×2 IMPLANT
PEN SKIN MARKING BROAD (MISCELLANEOUS) ×2 IMPLANT
PORT LAP GEL ALEXIS MED 5-9CM (MISCELLANEOUS) ×2 IMPLANT
RELOAD 45 VASCULAR/THIN (ENDOMECHANICALS) IMPLANT
RELOAD STAPLE TA45 3.5 REG BLU (ENDOMECHANICALS) IMPLANT
RELOAD STAPLER BLUE 60MM (STAPLE) ×1 IMPLANT
RELOAD WHITE ECR60W (STAPLE) ×4 IMPLANT
RTRCTR WOUND ALEXIS 18CM MED (MISCELLANEOUS)
SCISSORS LAP 5X35 DISP (ENDOMECHANICALS) ×2 IMPLANT
SET IRRIG TUBING LAPAROSCOPIC (IRRIGATION / IRRIGATOR) IMPLANT
SHEARS HARMONIC ACE PLUS 36CM (ENDOMECHANICALS) IMPLANT
SLEEVE ADV FIXATION 12X100MM (TROCAR) IMPLANT
SLEEVE ADV FIXATION 5X100MM (TROCAR) IMPLANT
SOLUTION ANTI FOG 6CC (MISCELLANEOUS) IMPLANT
SPONGE LAP 18X18 X RAY DECT (DISPOSABLE) IMPLANT
STAPLE ECHEON FLEX 60 POW ENDO (STAPLE) ×2 IMPLANT
STAPLER CIRC ILS CVD 33MM 37CM (STAPLE) ×2 IMPLANT
STAPLER RELOAD BLUE 60MM (STAPLE) ×2
STAPLER VISISTAT 35W (STAPLE) ×2 IMPLANT
SUCTION POOLE TIP (SUCTIONS) ×2 IMPLANT
SUT MNCRL AB 4-0 PS2 18 (SUTURE) ×4 IMPLANT
SUT PDS AB 0 CT1 36 (SUTURE) ×8 IMPLANT
SUT PDS AB 1 CT1 27 (SUTURE) IMPLANT
SUT PDS AB 1 CTX 36 (SUTURE) IMPLANT
SUT PDS AB 1 TP1 96 (SUTURE) IMPLANT
SUT PROLENE 2 0 KS (SUTURE) ×2 IMPLANT
SUT SILK 2 0 (SUTURE) ×1
SUT SILK 2 0 SH CR/8 (SUTURE) ×2 IMPLANT
SUT SILK 2-0 18XBRD TIE 12 (SUTURE) ×1 IMPLANT
SUT SILK 3 0 (SUTURE) ×1
SUT SILK 3 0 SH CR/8 (SUTURE) ×2 IMPLANT
SUT SILK 3-0 18XBRD TIE 12 (SUTURE) ×1 IMPLANT
SYS LAPSCP GELPORT 120MM (MISCELLANEOUS)
SYSTEM LAPSCP GELPORT 120MM (MISCELLANEOUS) IMPLANT
TOWEL OR 17X26 10 PK STRL BLUE (TOWEL DISPOSABLE) ×2 IMPLANT
TRAY FOLEY W/METER SILVER 14FR (SET/KITS/TRAYS/PACK) ×2 IMPLANT
TRAY FOLEY W/METER SILVER 16FR (SET/KITS/TRAYS/PACK) IMPLANT
TROCAR ADV FIXATION 11X100MM (TROCAR) IMPLANT
TROCAR ADV FIXATION 12X100MM (TROCAR) IMPLANT
TROCAR ADV FIXATION 5X100MM (TROCAR) IMPLANT
TROCAR BLADELESS OPT 5 100 (ENDOMECHANICALS) ×2 IMPLANT
TROCAR BLADELESS OPT 5 75 (ENDOMECHANICALS) IMPLANT
TROCAR XCEL 12X100 BLDLESS (ENDOMECHANICALS) ×2 IMPLANT
TROCAR XCEL BLUNT TIP 100MML (ENDOMECHANICALS) IMPLANT
TROCAR XCEL NON-BLD 11X100MML (ENDOMECHANICALS) IMPLANT
TUBING INSUFFLATION 10FT LAP (TUBING) ×2 IMPLANT
YANKAUER SUCT BULB TIP NO VENT (SUCTIONS) ×2 IMPLANT

## 2015-06-05 NOTE — Op Note (Signed)
Preoperative Diagnosis: sigmoid volvulus  Postoprative Diagnosis: sigmoid volvulus  Procedure: Procedure(s): LAPAROSCOPIC ASSISTED LOW ANTERIOR RESECTION   Surgeon: Excell Seltzer T   Assistants: Michael Boston M.D.  Anesthesia:  General endotracheal anesthesia  Indications: patient is a 44 year old female with a complex surgical history including previous hysterectomy complicated by apparent colonic obstruction postoperatively and number of years ago. I have previous records indicating abdominal exploration at that time showed sigmoid colon volvulus which was reduced but never resected. She subsequently had a laparoscopy and lysis of adhesions for recurrent lower abdominal pain several years ago all done at another institution. She now presents with worsening episodic and more severe left lower quadrant abdominal pain that is become debilitating. This is then relieved with explosive diarrhea and gas. She has had an extensive workup including colonoscopy with a segment of colitis that appeared ischemic in the sigmoid colon possibly due to recurrent volvulus.  After extensive preoperative discussion regarding benefits and risks detailed elsewhere with elective proceed with laparoscopic sigmoid colon resection in attempt to relieve her symptoms. She has received a mechanical antibiotic bowel prep at home.  Procedure Detail:  Patient was brought to the operating room, placed in the supine position on the operating table, and general endotracheal anesthesia induced. Foley catheter was placed. Orogastric tube was placed. She received preoperative IV antibiotics. PAS replaced. She was carefully positioned in semi-lithotomy position. The abdomen was widely sterilely prepped and draped. Patient timeout was performed and correct procedure verified. Access was obtained with a 5 mm Optiview trocar in the right upper quadrant without difficulty and pneumoperitoneum established. Under direct vision a 12 mm  trocar was placed in the right lower quadrant. There were omental adhesions up to the midline which were taken down with the Harmonic scalpel. As we worked over to the left abdomen the colon was seen to be markedly adherent to the lateral abdominal wall. Omental adhesions to the colon were taken down with the Harmonic scalpel and the omentum displaced into the upper abdomen. We then began to dissect laterally to the sigmoid colon in fairly dense adhesions mobilizing the colon. At this point we could see that there was a long loop or kink in the sigmoid colon which was adhered to itself with some adhesions distally in the pelvis as well and also adhesions proximally to this and an area of tattoo placed by GI at the time of her recent colonoscopy just proximal to this area. Initially extensive mobilization was done to lateral adhesions with sharp and Harmonic scalpel dissection working over toward the line of Toldt which was incised and we mobilized back toward the retroperitoneum laterally. At this point the mesentery of the sigmoid was approached medially exposing it just above the sacrum and incising the peritoneum and blunt dissection was carried back posteriorly to the vasa sigmoid. The iliac vessels and left ureter were readily identified, dissected posteriorly protected through the remainder of the dissection. The inferior mesenteric vessels were isolated and encircled with the ureter in view and protected and were divided with a single firing of the 60 mm white load stapler. Further dissection was then carried out laterally until we met our lateral dissection and at this point the sigmoid was completely free from its mesentery. The left colon was further mobilized up toward the spleen to allow mobility for anastomosis. Dissection was then carried down in the pelvis and again some fairly dense adhesions down in the pelvis and right about the peritoneal reflection were able to dissect into soft  area rectal  tissue in the upper rectum and mesorectum could be clearly identified. The mesorectum was sequentially divided and then a window was created between the mesorectum and the normal upper rectum for stapling. With very good visualization the rectum was then divided with a single firing of the blue load 60 mm echelon stapler. The remaining mesentery of the upper rectum and distal sigmoid was then sequentially divided with Harmonic scalpel again with the ureter in view and the colon was then completely freed. The divided proximal end was grasped for later identification and then the abdomen desufflated and a small left lower quadrant incision about 5 cm in length was used at McBurney's point with a muscle-splitting type of incision in the peritoneum entered. A wound protector was placed. The end of the divided rectosigmoid was brought up through the incision and then the specimen delivered out through the incision with very good mobility up to soft normal-appearing distal left colon which extended down to the patient's pubis under no tension. At this point the colon was cleared of mesentery and the pursestring clamp used to place a 2-0 Prolene pursestring suture and the bowel divided and the specimen removed. The end of the left colon was sized to a 33 mm without difficulty. 33 mm anvil was placed and the pursestring suture secured with nice healthy bowel over the anvil in all directions. The end of the bowel and anvil returned to the abdominal cavity and the wound protector sealed. Dr. Donovan Kail went below and was able to easily dilate the rectosigmoid to 33 mm dilator of the 33 mm Stealth EEA stapler was easily passed to the end of the rectal staple line and under direct vision the spike deployed just posteriorly to the middle of the staple line. The anvil was attached and the stapler closed with no extraneous tissue, left in place for 30 seconds for hemostasis and then fired and removed without difficulty. Both tissue  donuts were intact. With the bowel clamped proximally under saline irrigation the anastomosis was tensely distended with air with no evidence of leak. The abdomen was thoroughly irrigated evidence of bleeding or trocar injury or other problems. The omentum was brought back down in the pelvis. All trochars were removed and all gloves gowns and instruments changed. The left lower quadrant incision was closed in 3 layers with running 0 PDS. Wounds were closed with subcuticular Monocryl and Dermabond. Sponge needle and instrument counts were correct.    Findings: As above  Estimated Blood Loss:  200 mL         Drains: none  Blood Given: none          Specimens: rectosigmoid colon        Complications:  * No complications entered in OR log *         Disposition: PACU - hemodynamically stable.         Condition: stable

## 2015-06-05 NOTE — Anesthesia Procedure Notes (Signed)
Procedure Name: Intubation Date/Time: 06/05/2015 7:45 AM Performed by: Justice Rocher Pre-anesthesia Checklist: Patient identified, Emergency Drugs available, Suction available and Patient being monitored Patient Re-evaluated:Patient Re-evaluated prior to inductionOxygen Delivery Method: Circle System Utilized Preoxygenation: Pre-oxygenation with 100% oxygen Intubation Type: IV induction Ventilation: Mask ventilation without difficulty Laryngoscope Size: Mac and 4 Grade View: Grade II Tube type: Oral Tube size: 7.0 mm Number of attempts: 1 Airway Equipment and Method: Stylet and Oral airway Placement Confirmation: ETT inserted through vocal cords under direct vision,  positive ETCO2 and breath sounds checked- equal and bilateral Tube secured with: Tape Dental Injury: Teeth and Oropharynx as per pre-operative assessment

## 2015-06-05 NOTE — Interval H&P Note (Signed)
History and Physical Interval Note:  06/05/2015 7:34 AM  Stacy Russell  has presented today for surgery, with the diagnosis of sigmoid volvulus  The various methods of treatment have been discussed with the patient and family. After consideration of risks, benefits and other options for treatment, the patient has consented to  Procedure(s): LAPAROSCOPIC SIGMOID COLECTOMY (N/A) as a surgical intervention .  The patient's history has been reviewed, patient examined, no change in status, stable for surgery.  I have reviewed the patient's chart and labs.  Questions were answered to the patient's satisfaction.     Shakevia Sarris T

## 2015-06-05 NOTE — H&P (Signed)
History of Present Illness Marland Kitchen T. Ambrie Carte MD; 05/25/2015 12:54 PM) The patient is a 44 year old female who presents with non-malignant abdominal pain. She was initially seen for recurrent severe lower abdominal pain several weeks ago with history as below: She is a 44 year old female referred by Dr. Earlean Shawl for evaluation of worsening and recurrent left lower quadrant pain and history of sigmoid volvulus. She has a significant surgical history including laparoscopic cholecystectomy and subsequent laparoscopic ablation of endometriosis and vaginal hysterectomy, BSO and culdoplasty in 2005. This was all in the bladder. Just a couple of months following this she developed an acute illness with obstipation and underwent an open laparotomy with findings of sigmoid volvulus which was reduced and no resection done. This apparently was felt to be secondary to adhesions from her recent pelvic surgery. This is from the patient's recollection and from Dr. Liliane Channel note and review of records which we will try to obtain. She did okay for a while and moved to [place] a couple of years ago. She however has been having worsening episodes of left lower quadrant abdominal pain which now are occurring as often as a couple of times a week. These have gotten quite a bit worse since March. She will describe the onset of crampy or twisting left lower quadrant abdominal pain with some nausea but no vomiting. These will last several hours and then she will have marked diarrhea and resolution of her symptoms. The patient also in 2009 underwent what sounds like laparoscopic lysis of adhesions for abdominal pain by Dr. Aram Candela in [place], ? [facility] ?, With relief of her pain which she says was somewhat similar to her current symptoms but also less severe and somewhat different. She did have one episode of bleeding with diarrhea after an episode of pain in March. She apparently had a colonoscopy in  [place] 6 months following the volvulus surgery with findings of a tortuous colon but no other abnormalities identified. She has not had any colonoscopy since. Between episodes bowel movements are occasionally loose but no blood or mucus. Sometimes formed but soft. Since her last visit I been able to review her previous surgical records. Following her hysterectomy she did have a documented episode of sigmoid colon volvulus that was reduced but not resected. She has had a CT scan since her last visit which was not particularly remarkable. She has had colonoscopy by Dr. Earlean Shawl which showed a segment of apparent ischemic colitis mid sigmoid colon which he felt was consistent with sequela of recurrent volvulus. Since her original visit she actually has had increasing episodes of fairly severe lower abdominal pain which have occurred about twice per week which are relieved after copious diarrhea.   Allergies Elbert Ewings, [place]; 05/25/2015 12:06 PM) Codeine Phosphate *ANALGESICS - OPIOID* Dilaudid *ANALGESICS - OPIOID*  Medication History Elbert Ewings, CMA; 05/25/2015 12:06 PM) Sertraline HCl (50MG  Tablet, Oral) Active. Lisinopril-Hydrochlorothiazide (20-12.5MG  Tablet, Oral) Active. Pepcid (20MG  Tablet, Oral) Active. Diclofenac Sodium (75MG  Tablet DR, Oral) Active. Aspirin EC (325MG  Tablet DR, Oral) Active. Ambien (10MG  Tablet, Oral) Active. Medications Reconciled  Vitals Elbert Ewings CMA; 05/25/2015 12:07 PM) 05/25/2015 12:06 PM Weight: 240 lb Height: 70in Body Surface Area: 2.32 m Body Mass Index: 34.44 kg/m Temp.: 97.34F(Oral)  Pulse: 72 (Regular)  Resp.: 18 (Unlabored)  BP: 128/82 (Sitting, Left Arm, Standard)    Physical Exam Marland Kitchen T. Kingson Lohmeyer MD; 05/25/2015 12:55 PM) The physical exam findings are as follows: Note:General: Alert, well-developed and well nourished Caucasian female, in no  distress Skin: Warm and dry without rash or infection. HEENT: No palpable  masses or thyromegaly. Sclera nonicteric. Lungs: Breath sounds clear and equal. No wheezing or increased work of breathing. Cardiovascular: Regular rate and rhythm without murmer. No JVD or edema. Peripheral pulses intact. No carotid bruits. Abdomen: Nondistended. Soft and nontender. No masses palpable. No organomegaly. No palpable hernias. Healed low midline incision. Extremities: No edema or joint swelling or deformity. No chronic venous stasis changes. Neurologic: Alert and fully oriented. Gait normal. No focal weakness. Psychiatric: Normal mood and affect. Thought content appropriate with normal judgement and insight    Assessment & Plan Marland Kitchen T. Zerah Hilyer MD; 05/25/2015 12:57 PM) VOLVULUS OF SIGMOID COLON (560.2  K56.2) Impression: History and workup now strongly suggesting recurrent sigmoid volvulus and resulting ischemia. This was discussed extensively with the patient and her husband. We have previously discussed this by phone. We plan to proceed with laparoscopic and possible open sigmoid colectomy. We again discussed the nature of the procedure and its indications as well as potential risks of anesthetic complications, bleeding, infection, anastomotic leak, blood clots and possible need for open procedure or slight chance for needing temporary ostomy. There were given literature regarding the procedure. Bowel prep was given. All questions answered.

## 2015-06-05 NOTE — Anesthesia Postprocedure Evaluation (Signed)
  Anesthesia Post-op Note  Patient: Stacy Russell  Procedure(s) Performed: Procedure(s) (LRB): LAPAROSCOPIC ASSISTED LOW ANTERIOR RESECTION (N/A)  Patient Location: PACU  Anesthesia Type: General  Level of Consciousness: awake and alert   Airway and Oxygen Therapy: Patient Spontanous Breathing  Post-op Pain: mild  Post-op Assessment: Post-op Vital signs reviewed, Patient's Cardiovascular Status Stable, Respiratory Function Stable, Patent Airway and No signs of Nausea or vomiting  Last Vitals:  Filed Vitals:   06/05/15 1200  BP: 165/94  Pulse: 85  Temp:   Resp: 16    Post-op Vital Signs: stable   Complications: No apparent anesthesia complications

## 2015-06-05 NOTE — Progress Notes (Signed)
Patient arrived actively vomiting bile from bowel prep and antibiotics yesterday. BP elevated. Throbbing headache. Unable to take Alvimopam. Taken to Holding area. Handed off to RN

## 2015-06-05 NOTE — Progress Notes (Signed)
Patient stated she took the potassium called in for her , as instructed, this past week.

## 2015-06-05 NOTE — Anesthesia Preprocedure Evaluation (Addendum)
Anesthesia Evaluation  Patient identified by MRN, date of birth, ID band Patient awake    Reviewed: Allergy & Precautions, NPO status , Patient's Chart, lab work & pertinent test results  History of Anesthesia Complications (+) PONV  Airway Mallampati: II  TM Distance: >3 FB Neck ROM: Full    Dental no notable dental hx.    Pulmonary neg pulmonary ROS,  breath sounds clear to auscultation  Pulmonary exam normal       Cardiovascular hypertension, Pt. on medications Normal cardiovascular examRhythm:Regular Rate:Normal     Neuro/Psych negative neurological ROS  negative psych ROS   GI/Hepatic negative GI ROS, Neg liver ROS,   Endo/Other  negative endocrine ROS  Renal/GU negative Renal ROS  negative genitourinary   Musculoskeletal negative musculoskeletal ROS (+)   Abdominal   Peds negative pediatric ROS (+)  Hematology negative hematology ROS (+)   Anesthesia Other Findings   Reproductive/Obstetrics negative OB ROS                            Anesthesia Physical Anesthesia Plan  ASA: II  Anesthesia Plan: General   Post-op Pain Management:    Induction: Intravenous, Rapid sequence and Cricoid pressure planned  Airway Management Planned: Oral ETT  Additional Equipment:   Intra-op Plan:   Post-operative Plan: Extubation in OR  Informed Consent: I have reviewed the patients History and Physical, chart, labs and discussed the procedure including the risks, benefits and alternatives for the proposed anesthesia with the patient or authorized representative who has indicated his/her understanding and acceptance.   Dental advisory given  Plan Discussed with: CRNA  Anesthesia Plan Comments:         Anesthesia Quick Evaluation

## 2015-06-05 NOTE — Transfer of Care (Signed)
Immediate Anesthesia Transfer of Care Note  Patient: Stacy Russell  Procedure(s) Performed: Procedure(s) (LRB): LAPAROSCOPIC ASSISTED LOW ANTERIOR RESECTION (N/A)  Patient Location: PACU  Anesthesia Type: General  Level of Consciousness: awake, sedated, patient cooperative and responds to stimulation  Airway & Oxygen Therapy: Patient Spontanous Breathing and Patient connected to face mask oxygen  Post-op Assessment: Report given to PACU RN, Post -op Vital signs reviewed and stable and Patient moving all extremities  Post vital signs: Reviewed and stable  Complications: No apparent anesthesia complications

## 2015-06-06 ENCOUNTER — Encounter (HOSPITAL_COMMUNITY): Payer: Self-pay | Admitting: General Surgery

## 2015-06-06 LAB — CBC
HCT: 34.6 % — ABNORMAL LOW (ref 36.0–46.0)
HEMOGLOBIN: 11.5 g/dL — AB (ref 12.0–15.0)
MCH: 29.8 pg (ref 26.0–34.0)
MCHC: 33.2 g/dL (ref 30.0–36.0)
MCV: 89.6 fL (ref 78.0–100.0)
Platelets: 278 10*3/uL (ref 150–400)
RBC: 3.86 MIL/uL — AB (ref 3.87–5.11)
RDW: 12.9 % (ref 11.5–15.5)
WBC: 10.9 10*3/uL — AB (ref 4.0–10.5)

## 2015-06-06 LAB — BASIC METABOLIC PANEL
Anion gap: 7 (ref 5–15)
BUN: 9 mg/dL (ref 6–20)
CHLORIDE: 103 mmol/L (ref 101–111)
CO2: 27 mmol/L (ref 22–32)
CREATININE: 0.68 mg/dL (ref 0.44–1.00)
Calcium: 8.6 mg/dL — ABNORMAL LOW (ref 8.9–10.3)
GFR calc non Af Amer: >60 mL/min (ref >60)
Glucose, Bld: 102 mg/dL — ABNORMAL HIGH (ref 65–99)
POTASSIUM: 3.3 mmol/L — AB (ref 3.5–5.1)
SODIUM: 137 mmol/L (ref 135–145)

## 2015-06-06 MED ORDER — DIPHENHYDRAMINE HCL 25 MG PO CAPS
25.0000 mg | ORAL_CAPSULE | Freq: Four times a day (QID) | ORAL | Status: DC | PRN
  Administered 2015-06-06: 25 mg via ORAL
  Filled 2015-06-06 (×2): qty 1

## 2015-06-06 MED ORDER — OXYCODONE HCL 5 MG PO TABS
10.0000 mg | ORAL_TABLET | ORAL | Status: DC | PRN
Start: 2015-06-06 — End: 2015-06-08
  Administered 2015-06-06 – 2015-06-08 (×12): 10 mg via ORAL
  Filled 2015-06-06 (×13): qty 2

## 2015-06-06 MED ORDER — PANTOPRAZOLE SODIUM 40 MG PO TBEC
40.0000 mg | DELAYED_RELEASE_TABLET | Freq: Every day | ORAL | Status: DC
  Administered 2015-06-06 – 2015-06-08 (×3): 40 mg via ORAL
  Filled 2015-06-06 (×4): qty 1

## 2015-06-06 NOTE — Care Management Note (Signed)
Case Management Note  Patient Details  Name: Stana Bayon MRN: [id number] Date of Birth: November 29, 1970  Subjective/Objective:           laparoscopic sigmoid colon resection          Action/Plan: Discharge planning  Expected Discharge Date:                  Expected Discharge Plan:  Home/Self Care  In-House Referral:  NA  Discharge planning Services  CM Consult  Post Acute Care Choice:  NA Choice offered to:  NA  DME Arranged:  N/A DME Agency:  NA  HH Arranged:  NA HH Agency:  NA  Status of Service:  Completed, signed off  Medicare Important Message Given:    Date Medicare IM Given:    Medicare IM give by:    Date Additional Medicare IM Given:    Additional Medicare Important Message give by:     If discussed at [place] of Stay Meetings, dates discussed:    Additional Comments:  Guadalupe Maple, RN 06/06/2015, 10:45 AM

## 2015-06-06 NOTE — Progress Notes (Signed)
Patient ID: Stacy Russell, female   DOB: [birth year], 44 y.o.   MRN: [id number] 1 Day Post-Op  Subjective: Doing pretty well. Some lower abdominal pain but well managed with meds. Nausea has resolved.  Objective: Vital signs in last 24 hours: Temp:  [97.5 F (36.4 C)-98.5 F (36.9 C)] 97.9 F (36.6 C) (08/23 0944) Pulse Rate:  [78-99] 80 (08/23 0944) Resp:  [9-18] 16 (08/23 0944) BP: (99-201)/(44-111) 133/72 mmHg (08/23 0944) SpO2:  [97 %-100 %] 99 % (08/23 0944)    Intake/Output from previous day: 08/22 0701 - 08/23 0700 In: 5320 [P.O.:460; I.V.:4860] Out: 2675 [Urine:2675] Intake/Output this shift:    General appearance: alert, cooperative and no distress GI: nondistended. Very minimal appropriate tenderness. Incision/Wound: clean and dry  Lab Results:   Recent Labs  06/06/15 0423  WBC 10.9*  HGB 11.5*  HCT 34.6*  PLT 278   BMET  Recent Labs  06/06/15 0423  NA 137  K 3.3*  CL 103  CO2 27  GLUCOSE 102*  BUN 9  CREATININE 0.68  CALCIUM 8.6*     Studies/Results: No results found.  Anti-infectives: Anti-infectives    Start     Dose/Rate Route Frequency Ordered Stop   06/05/15 0616  cefoTEtan (CEFOTAN) 2 g in dextrose 5 % 50 mL IVPB     2 g 100 mL/hr over 30 Minutes Intravenous On call to O.R. 06/05/15 5852 06/05/15 0802   06/05/15 0616  neomycin (MYCIFRADIN) tablet 1,000 mg  Status:  Discontinued     1,000 mg Oral 3 times per day 06/05/15 0616 06/05/15 1313   06/05/15 0616  erythromycin (E-MYCIN) tablet 1,000 mg  Status:  Discontinued     1,000 mg Oral 3 times per day 06/05/15 0616 06/05/15 1313      Assessment/Plan: s/p Procedure(s): LAPAROSCOPIC ASSISTED LOW ANTERIOR RESECTION Doing well without apparent complication. Start clear liquid diet. Continue ambulation.   LOS: 1 day    Chalsea Darko T 06/06/2015

## 2015-06-07 LAB — CBC
HEMATOCRIT: 31.7 % — AB (ref 36.0–46.0)
Hemoglobin: 10 g/dL — ABNORMAL LOW (ref 12.0–15.0)
MCH: 28.8 pg (ref 26.0–34.0)
MCHC: 31.5 g/dL (ref 30.0–36.0)
MCV: 91.4 fL (ref 78.0–100.0)
PLATELETS: 217 10*3/uL (ref 150–400)
RBC: 3.47 MIL/uL — AB (ref 3.87–5.11)
RDW: 13 % (ref 11.5–15.5)
WBC: 8 10*3/uL (ref 4.0–10.5)

## 2015-06-07 LAB — BASIC METABOLIC PANEL
ANION GAP: 8 (ref 5–15)
BUN: 7 mg/dL (ref 6–20)
CO2: 26 mmol/L (ref 22–32)
Calcium: 8.4 mg/dL — ABNORMAL LOW (ref 8.9–10.3)
Chloride: 102 mmol/L (ref 101–111)
Creatinine, Ser: 0.63 mg/dL (ref 0.44–1.00)
GLUCOSE: 114 mg/dL — AB (ref 65–99)
POTASSIUM: 3.4 mmol/L — AB (ref 3.5–5.1)
Sodium: 136 mmol/L (ref 135–145)

## 2015-06-07 NOTE — Progress Notes (Signed)
Patient ID: Stacy Russell, female   DOB: [birth year], 44 y.o.   MRN: [id number] 2 Days Post-Op  Subjective: Feels well this morning. Mild pain is controlled with medications. Tolerating clear liquid diet. Has had a couple loose bowel movements with some mucus and blood.  Objective: Vital signs in last 24 hours: Temp:  [97.7 F (36.5 C)-98 F (36.7 C)] 98 F (36.7 C) (08/24 0541) Pulse Rate:  [80-98] 87 (08/24 0541) Resp:  [16-18] 18 (08/24 0541) BP: (113-133)/(52-75) 113/59 mmHg (08/24 0541) SpO2:  [93 %-99 %] 94 % (08/24 0541) Last BM Date: 06/07/15  Intake/Output from previous day: 08/23 0701 - 08/24 0700 In: 1920 [P.O.:720; I.V.:1200] Out: 1125 [Urine:1125] Intake/Output this shift:    General appearance: alert, cooperative and no distress GI: minimal lower abdominal tenderness without guarding Incision/Wound: clean and dry without evidence of infection  Lab Results:   Recent Labs  06/06/15 0423 06/07/15 0543  WBC 10.9* 8.0  HGB 11.5* 10.0*  HCT 34.6* 31.7*  PLT 278 217   BMET  Recent Labs  06/06/15 0423 06/07/15 0543  NA 137 136  K 3.3* 3.4*  CL 103 102  CO2 27 26  GLUCOSE 102* 114*  BUN 9 7  CREATININE 0.68 0.63  CALCIUM 8.6* 8.4*     Studies/Results: No results found.  Anti-infectives: Anti-infectives    Start     Dose/Rate Route Frequency Ordered Stop   06/05/15 0616  cefoTEtan (CEFOTAN) 2 g in dextrose 5 % 50 mL IVPB     2 g 100 mL/hr over 30 Minutes Intravenous On call to O.R. 06/05/15 7741 06/05/15 0802   06/05/15 0616  neomycin (MYCIFRADIN) tablet 1,000 mg  Status:  Discontinued     1,000 mg Oral 3 times per day 06/05/15 0616 06/05/15 1313   06/05/15 0616  erythromycin (E-MYCIN) tablet 1,000 mg  Status:  Discontinued     1,000 mg Oral 3 times per day 06/05/15 0616 06/05/15 1313      Assessment/Plan: s/p Procedure(s): LAPAROSCOPIC ASSISTED LOW ANTERIOR RESECTION Doing very well without apparent complication. Advance to full liquid  diet. Possible discharge tomorrow.   LOS: 2 days    Cobey Raineri T 06/07/2015

## 2015-06-08 MED ORDER — OXYCODONE HCL 10 MG PO TABS: 10.0000 mg | ORAL_TABLET | ORAL | Status: DC | PRN

## 2015-06-08 NOTE — Progress Notes (Signed)
Assessment unchanged. Pt and husband verbalized understanding of dc instructions through teach back. Script x 1 given by Dr. Excell Seltzer. Discharged via wc to front entrance to meet awaiting vehicle to carry home. Accompanied by NT and husband.

## 2015-06-08 NOTE — Progress Notes (Signed)
Patient ID: Stacy Russell, female   DOB: Jul 13, 1971, 44 y.o.   MRN: [id number] 3 Days Post-Op  Subjective: Feels well, no complaints. Tolerating diet. Having loose bowel movements without blood.  Objective: Vital signs in last 24 hours: Temp:  [97.5 F (36.4 C)-98.1 F (36.7 C)] 97.5 F (36.4 C) (08/25 0531) Pulse Rate:  [79-100] 79 (08/25 0531) Resp:  [18] 18 (08/25 0531) BP: (126-153)/(61-79) 153/79 mmHg (08/25 0531) SpO2:  [96 %-98 %] 98 % (08/25 0531) Last BM Date: 06/08/15  Intake/Output from previous day: 08/24 0701 - 08/25 0700 In: 1200 [P.O.:600; I.V.:600] Out: 3900 [Urine:3900] Intake/Output this shift:    General appearance: alert, cooperative and no distress GI: normal findings: soft, non-tender and nondistended Incision/Wound: no erythema or drainage or swelling  Lab Results:   Recent Labs  06/06/15 0423 06/07/15 0543  WBC 10.9* 8.0  HGB 11.5* 10.0*  HCT 34.6* 31.7*  PLT 278 217   BMET  Recent Labs  06/06/15 0423 06/07/15 0543  NA 137 136  K 3.3* 3.4*  CL 103 102  CO2 27 26  GLUCOSE 102* 114*  BUN 9 7  CREATININE 0.68 0.63  CALCIUM 8.6* 8.4*     Studies/Results: No results found.  Anti-infectives: Anti-infectives    Start     Dose/Rate Route Frequency Ordered Stop   06/05/15 0616  cefoTEtan (CEFOTAN) 2 g in dextrose 5 % 50 mL IVPB     2 g 100 mL/hr over 30 Minutes Intravenous On call to O.R. 06/05/15 7341 06/05/15 0802   06/05/15 0616  neomycin (MYCIFRADIN) tablet 1,000 mg  Status:  Discontinued     1,000 mg Oral 3 times per day 06/05/15 0616 06/05/15 1313   06/05/15 0616  erythromycin (E-MYCIN) tablet 1,000 mg  Status:  Discontinued     1,000 mg Oral 3 times per day 06/05/15 0616 06/05/15 1313      Assessment/Plan: s/p Procedure(s): LAPAROSCOPIC ASSISTED LOW ANTERIOR RESECTION Doing well without apparent complication. Okay for discharge today.   LOS: 3 days    Mckade Gurka T 06/08/2015

## 2015-06-08 NOTE — Discharge Instructions (Signed)
[facility]      [facility] [phone]  OPEN ABDOMINAL SURGERY: POST OP INSTRUCTIONS  Always review your discharge instruction sheet given to you by the facility where your surgery was performed.  IF YOU HAVE DISABILITY OR FAMILY LEAVE FORMS, YOU MUST BRING THEM TO THE OFFICE FOR PROCESSING.  PLEASE DO NOT GIVE THEM TO YOUR DOCTOR.  1. A prescription for pain medication may be given to you upon discharge.  Take your pain medication as prescribed, if needed.  If narcotic pain medicine is not needed, then you may take acetaminophen (Tylenol) or ibuprofen (Advil) as needed. 2. Take your usually prescribed medications unless otherwise directed. 3. If you need a refill on your pain medication, please contact your pharmacy. They will contact our office to request authorization.  Prescriptions will not be filled after 5pm or on week-ends. 4. You should follow a light diet the first few days after arrival home, such as soup and crackers, pudding, etc.unless your doctor has advised otherwise. A high-fiber, low fat diet can be resumed as tolerated.   Be sure to include lots of fluids daily. Most patients will experience some swelling and bruising on the chest and neck area.  Ice packs will help.  Swelling and bruising can take several days to resolve 5. Most patients will experience some swelling and bruising in the area of the incision. Ice pack will help. Swelling and bruising can take several days to resolve..  6. It is common to experience some constipation if taking pain medication after surgery.  Increasing fluid intake and taking a stool softener will usually help or prevent this problem from occurring.  A mild laxative (Milk of Magnesia or Miralax) should be taken according to package directions if there are no bowel movements after 48 hours. 7.  You may have steri-strips (small skin tapes) in place directly over the incision.  These strips should be left on the skin for 7-10 days.  If your  surgeon used skin glue on the incision, you may shower in 24 hours.  The glue will flake off over the next 2-3 weeks.  Any sutures or staples will be removed at the office during your follow-up visit. You may find that a light gauze bandage over your incision may keep your staples from being rubbed or pulled. You may shower and replace the bandage daily. 8. ACTIVITIES:  You may resume regular (light) daily activities beginning the next day--such as daily self-care, walking, climbing stairs--gradually increasing activities as tolerated.  You may have sexual intercourse when it is comfortable.  Refrain from any heavy lifting or straining until approved by your doctor. a. You may drive when you no longer are taking prescription pain medication, you can comfortably wear a seatbelt, and you can safely maneuver your car and apply brakes b. Return to Work: ___________________________________ 9. You should see your doctor in the office for a follow-up appointment approximately two weeks after your surgery.  Make sure that you call for this appointment within a day or two after you arrive home to insure a convenient appointment time. OTHER INSTRUCTIONS:  _____________________________________________________________ _____________________________________________________________  WHEN TO CALL YOUR DOCTOR: 1. Fever over 101.0 2. Inability to urinate 3. Nausea and/or vomiting 4. Extreme swelling or bruising 5. Continued bleeding from incision. 6. Increased pain, redness, or drainage from the incision. 7. Difficulty swallowing or breathing 8. Muscle cramping or spasms. 9. Numbness or tingling in hands or feet or around lips.  The clinic staff is available to   answer your questions during regular business hours.  Please don't hesitate to call and ask to speak to one of the nurses if you have concerns.  For further questions, please visit [email or web address]   

## 2015-06-12 NOTE — Discharge Summary (Signed)
  Patient ID: Stacy Russell [id number] 44 y.o. July 09, 1971  06/05/2015  Discharge date and time: 06/08/2015   Admitting Physician: Excell Seltzer T  Discharge Physician: Excell Seltzer T  Admission Diagnoses: sigmoid volvulus  Discharge Diagnoses: same  Operations: Procedure(s): LAPAROSCOPIC ASSISTED LOW ANTERIOR RESECTION  Admission Condition: fair  Discharged Condition: good  Indication for Admission: patient is a 44 year old female with a remote history of sigmoid colon volvulus which occurred postoperatively after pelvic surgery. She underwent reduction of her sigmoid volvulus according to her operative note from out of town but never underwent resection. She recently has had increasing episodes of severe left lower quadrant abdominal pain relieved with explosive diarrhea. Colonoscopy has shown an area of apparent ischemia after one of these episodes. Following this and other extensive workup with elected to proceed with laparoscopic sigmoid colectomy with a presumptive diagnosis of intermittent sigmoid volvulus.  Hospital Course: on the morning of admission the patient underwent laparoscopic low anterior resection with findings of extensive adhesions around the sigmoid and rectosigmoid colon with an adhesed redundant loop of sigmoid colon which appeared consistent with the preoperative diagnosis. She tolerated the procedure well. Her postoperative course was uncomplicated. On the first postoperative day she had some mild to moderate lower abdominal discomfort as expected. CBC was unremarkable. Minimally elevated white count. She was started on a clear liquid diet and tolerated this well. Her hemoglobin remained stable and white count decreased to normal. Her pain rapidly improved. She was advanced to a full liquid diet and began to have bowel movements. No nausea. She is felt ready for discharge on the third postoperative day. Abdomen is soft and nontender. Incisions clean.  Tolerating a soft diet. CBC unremarkable. Final pathology showed adhesions without intrinsic abnormality of the colon. The tattoo was noted to be within the specimen.   Disposition: Home  Patient Instructions:    Medication List    STOP taking these medications        cefPROZIL 500 MG tablet  Commonly known as:  CEFZIL      TAKE these medications        ADVIL 200 MG tablet  Generic drug:  ibuprofen  Take 400 mg by mouth every 4 (four) hours as needed for headache.     DSS 100 MG Caps  Take 100 mg by mouth 2 (two) times daily.     lisinopril 10 MG tablet  Commonly known as:  PRINIVIL,ZESTRIL  TAKE ONE TABLET BY MOUTH ONCE DAILY.     lisinopril-hydrochlorothiazide 20-12.5 MG per tablet  Commonly known as:  PRINZIDE,ZESTORETIC  Take 1 tablet by mouth every morning.     oxyCODONE 5 MG immediate release tablet  Commonly known as:  Oxy IR/ROXICODONE  Take 1-3 tablets (5-15 mg total) by mouth every 4 (four) hours as needed for moderate pain or severe pain.     Oxycodone HCl 10 MG Tabs  Take 1 tablet (10 mg total) by mouth every 4 (four) hours as needed for moderate pain.     sertraline 50 MG tablet  Commonly known as:  ZOLOFT  Take 50 mg by mouth at bedtime.     zolpidem 10 MG tablet  Commonly known as:  AMBIEN  Take 5 mg by mouth at bedtime as needed for sleep.        Activity: no heavy lifting for 4 weeks Diet: ssoft diet Wound Care: none needed  Follow-up:  With Dr. Excell Seltzer in 2 weeks.  Signed: Edward Jolly MD, FACS  06/12/2015, 9:35 AM

## 2015-07-17 ENCOUNTER — Telehealth: Payer: Self-pay | Admitting: Orthopedic Surgery

## 2015-07-17 NOTE — Telephone Encounter (Signed)
Unfortunately we do not take care of those types of problems if she really wants to transfer care I would refer her to Dr. Marcelino Scot a traumatologist

## 2015-07-17 NOTE — Telephone Encounter (Signed)
Called back to patient to relay - left voice message. °

## 2015-07-17 NOTE — Telephone Encounter (Signed)
Patient called to relay that she is having pain and "some changes" to right ankle, for which she had surgery by Dr Alvan Dame (IM nail) on 02/07/14.  She had already been seen at our office for a 2nd opinion  on 09/21/14 but had not requested transfer of care at that time. States prefers to come here for care of her ankle.  Relayed that Dr Aline Brochure would need to re-review and advise.  Patient's ph# is [phone]

## 2015-07-18 NOTE — Telephone Encounter (Signed)
Patient returned call; relayed per Dr Harrison's response.

## 2016-01-22 DIAGNOSIS — N390 Urinary tract infection, site not specified: Secondary | ICD-10-CM | POA: Diagnosis not present

## 2016-01-22 DIAGNOSIS — R319 Hematuria, unspecified: Secondary | ICD-10-CM | POA: Diagnosis not present

## 2016-04-15 DIAGNOSIS — E782 Mixed hyperlipidemia: Secondary | ICD-10-CM | POA: Diagnosis not present

## 2016-04-15 DIAGNOSIS — R7301 Impaired fasting glucose: Secondary | ICD-10-CM | POA: Diagnosis not present

## 2016-04-17 DIAGNOSIS — F5101 Primary insomnia: Secondary | ICD-10-CM | POA: Diagnosis not present

## 2016-04-17 DIAGNOSIS — E782 Mixed hyperlipidemia: Secondary | ICD-10-CM | POA: Diagnosis not present

## 2016-04-17 DIAGNOSIS — I10 Essential (primary) hypertension: Secondary | ICD-10-CM | POA: Diagnosis not present

## 2016-04-17 DIAGNOSIS — R7301 Impaired fasting glucose: Secondary | ICD-10-CM | POA: Diagnosis not present

## 2016-05-24 DIAGNOSIS — R002 Palpitations: Secondary | ICD-10-CM | POA: Diagnosis not present

## 2016-05-24 DIAGNOSIS — I10 Essential (primary) hypertension: Secondary | ICD-10-CM | POA: Diagnosis not present

## 2016-05-28 DIAGNOSIS — R002 Palpitations: Secondary | ICD-10-CM | POA: Diagnosis not present

## 2016-05-29 ENCOUNTER — Other Ambulatory Visit: Payer: Self-pay | Admitting: Internal Medicine

## 2016-05-29 DIAGNOSIS — Z1231 Encounter for screening mammogram for malignant neoplasm of breast: Secondary | ICD-10-CM

## 2016-06-11 ENCOUNTER — Ambulatory Visit
Admission: RE | Admit: 2016-06-11 | Discharge: 2016-06-11 | Disposition: A | Discharge status: Home / Self Care | Payer: BLUE CROSS/BLUE SHIELD | Source: Ambulatory Visit | Attending: Internal Medicine | Admitting: Internal Medicine

## 2016-06-11 DIAGNOSIS — Z1231 Encounter for screening mammogram for malignant neoplasm of breast: Secondary | ICD-10-CM

## 2016-07-29 DIAGNOSIS — M25571 Pain in right ankle and joints of right foot: Secondary | ICD-10-CM | POA: Diagnosis not present

## 2016-07-29 DIAGNOSIS — M25561 Pain in right knee: Secondary | ICD-10-CM | POA: Diagnosis not present

## 2016-08-13 DIAGNOSIS — Z6836 Body mass index (BMI) 36.0-36.9, adult: Secondary | ICD-10-CM | POA: Diagnosis not present

## 2016-08-13 DIAGNOSIS — J060 Acute laryngopharyngitis: Secondary | ICD-10-CM | POA: Diagnosis not present

## 2016-11-05 DIAGNOSIS — I10 Essential (primary) hypertension: Secondary | ICD-10-CM | POA: Diagnosis not present

## 2016-11-05 DIAGNOSIS — E782 Mixed hyperlipidemia: Secondary | ICD-10-CM | POA: Diagnosis not present

## 2016-11-05 DIAGNOSIS — R7301 Impaired fasting glucose: Secondary | ICD-10-CM | POA: Diagnosis not present

## 2016-11-06 DIAGNOSIS — I10 Essential (primary) hypertension: Secondary | ICD-10-CM | POA: Diagnosis not present

## 2016-11-06 DIAGNOSIS — R7301 Impaired fasting glucose: Secondary | ICD-10-CM | POA: Diagnosis not present

## 2016-11-06 DIAGNOSIS — Z0001 Encounter for general adult medical examination with abnormal findings: Secondary | ICD-10-CM | POA: Diagnosis not present

## 2016-11-06 DIAGNOSIS — E782 Mixed hyperlipidemia: Secondary | ICD-10-CM | POA: Diagnosis not present

## 2017-02-28 DIAGNOSIS — J0100 Acute maxillary sinusitis, unspecified: Secondary | ICD-10-CM | POA: Diagnosis not present

## 2017-04-18 DIAGNOSIS — M25561 Pain in right knee: Secondary | ICD-10-CM | POA: Diagnosis not present

## 2017-04-18 DIAGNOSIS — M7651 Patellar tendinitis, right knee: Secondary | ICD-10-CM | POA: Diagnosis not present

## 2017-04-30 DIAGNOSIS — D229 Melanocytic nevi, unspecified: Secondary | ICD-10-CM | POA: Diagnosis not present

## 2017-04-30 DIAGNOSIS — L570 Actinic keratosis: Secondary | ICD-10-CM | POA: Diagnosis not present

## 2017-04-30 DIAGNOSIS — L821 Other seborrheic keratosis: Secondary | ICD-10-CM | POA: Diagnosis not present

## 2017-05-21 DIAGNOSIS — H578 Other specified disorders of eye and adnexa: Secondary | ICD-10-CM | POA: Diagnosis not present

## 2017-05-21 DIAGNOSIS — H9209 Otalgia, unspecified ear: Secondary | ICD-10-CM | POA: Diagnosis not present

## 2017-05-21 DIAGNOSIS — B349 Viral infection, unspecified: Secondary | ICD-10-CM | POA: Diagnosis not present

## 2017-05-21 DIAGNOSIS — Z6836 Body mass index (BMI) 36.0-36.9, adult: Secondary | ICD-10-CM | POA: Diagnosis not present

## 2017-06-02 ENCOUNTER — Other Ambulatory Visit: Payer: Self-pay | Admitting: Internal Medicine

## 2017-06-02 DIAGNOSIS — Z1231 Encounter for screening mammogram for malignant neoplasm of breast: Secondary | ICD-10-CM

## 2017-06-04 DIAGNOSIS — E8889 Other specified metabolic disorders: Secondary | ICD-10-CM | POA: Diagnosis not present

## 2017-06-04 DIAGNOSIS — M7651 Patellar tendinitis, right knee: Secondary | ICD-10-CM | POA: Diagnosis not present

## 2017-06-04 DIAGNOSIS — M25561 Pain in right knee: Secondary | ICD-10-CM | POA: Diagnosis not present

## 2017-06-12 ENCOUNTER — Other Ambulatory Visit: Payer: Self-pay | Admitting: Orthopedic Surgery

## 2017-06-12 DIAGNOSIS — M25561 Pain in right knee: Secondary | ICD-10-CM

## 2017-06-26 ENCOUNTER — Ambulatory Visit
Admission: RE | Admit: 2017-06-26 | Discharge: 2017-06-26 | Disposition: A | Discharge status: Home / Self Care | Payer: BLUE CROSS/BLUE SHIELD | Source: Ambulatory Visit | Attending: Internal Medicine | Admitting: Internal Medicine

## 2017-06-26 ENCOUNTER — Other Ambulatory Visit: Payer: BLUE CROSS/BLUE SHIELD

## 2017-06-26 DIAGNOSIS — Z1231 Encounter for screening mammogram for malignant neoplasm of breast: Secondary | ICD-10-CM

## 2017-07-21 DIAGNOSIS — I10 Essential (primary) hypertension: Secondary | ICD-10-CM | POA: Diagnosis not present

## 2017-07-21 DIAGNOSIS — E782 Mixed hyperlipidemia: Secondary | ICD-10-CM | POA: Diagnosis not present

## 2017-07-21 DIAGNOSIS — R7301 Impaired fasting glucose: Secondary | ICD-10-CM | POA: Diagnosis not present

## 2017-07-21 DIAGNOSIS — R5381 Other malaise: Secondary | ICD-10-CM | POA: Diagnosis not present

## 2017-07-21 DIAGNOSIS — R5383 Other fatigue: Secondary | ICD-10-CM | POA: Diagnosis not present

## 2017-07-25 DIAGNOSIS — R7301 Impaired fasting glucose: Secondary | ICD-10-CM | POA: Diagnosis not present

## 2017-07-25 DIAGNOSIS — I10 Essential (primary) hypertension: Secondary | ICD-10-CM | POA: Diagnosis not present

## 2017-07-25 DIAGNOSIS — F5101 Primary insomnia: Secondary | ICD-10-CM | POA: Diagnosis not present

## 2017-07-25 DIAGNOSIS — E782 Mixed hyperlipidemia: Secondary | ICD-10-CM | POA: Diagnosis not present

## 2017-09-09 DIAGNOSIS — J019 Acute sinusitis, unspecified: Secondary | ICD-10-CM | POA: Diagnosis not present

## 2017-10-22 DIAGNOSIS — D229 Melanocytic nevi, unspecified: Secondary | ICD-10-CM | POA: Diagnosis not present

## 2017-10-22 DIAGNOSIS — L821 Other seborrheic keratosis: Secondary | ICD-10-CM | POA: Diagnosis not present

## 2017-11-21 DIAGNOSIS — J019 Acute sinusitis, unspecified: Secondary | ICD-10-CM | POA: Diagnosis not present

## 2017-11-28 DIAGNOSIS — R1013 Epigastric pain: Secondary | ICD-10-CM | POA: Diagnosis not present

## 2017-11-28 DIAGNOSIS — R1030 Lower abdominal pain, unspecified: Secondary | ICD-10-CM | POA: Diagnosis not present

## 2017-11-28 DIAGNOSIS — I10 Essential (primary) hypertension: Secondary | ICD-10-CM | POA: Insufficient documentation

## 2018-02-27 DIAGNOSIS — F4320 Adjustment disorder, unspecified: Secondary | ICD-10-CM | POA: Diagnosis not present

## 2018-03-06 DIAGNOSIS — F4320 Adjustment disorder, unspecified: Secondary | ICD-10-CM | POA: Diagnosis not present

## 2018-03-12 DIAGNOSIS — F4320 Adjustment disorder, unspecified: Secondary | ICD-10-CM | POA: Diagnosis not present

## 2018-03-17 DIAGNOSIS — I10 Essential (primary) hypertension: Secondary | ICD-10-CM | POA: Diagnosis not present

## 2018-03-17 DIAGNOSIS — J019 Acute sinusitis, unspecified: Secondary | ICD-10-CM | POA: Diagnosis not present

## 2018-03-17 DIAGNOSIS — R7301 Impaired fasting glucose: Secondary | ICD-10-CM | POA: Diagnosis not present

## 2018-03-17 DIAGNOSIS — E782 Mixed hyperlipidemia: Secondary | ICD-10-CM | POA: Diagnosis not present

## 2018-03-17 DIAGNOSIS — Z6836 Body mass index (BMI) 36.0-36.9, adult: Secondary | ICD-10-CM | POA: Diagnosis not present

## 2018-03-20 DIAGNOSIS — E782 Mixed hyperlipidemia: Secondary | ICD-10-CM | POA: Diagnosis not present

## 2018-03-20 DIAGNOSIS — G4700 Insomnia, unspecified: Secondary | ICD-10-CM | POA: Diagnosis not present

## 2018-03-20 DIAGNOSIS — R7301 Impaired fasting glucose: Secondary | ICD-10-CM | POA: Diagnosis not present

## 2018-03-20 DIAGNOSIS — I10 Essential (primary) hypertension: Secondary | ICD-10-CM | POA: Diagnosis not present

## 2018-04-28 DIAGNOSIS — F4320 Adjustment disorder, unspecified: Secondary | ICD-10-CM | POA: Diagnosis not present

## 2018-05-19 DIAGNOSIS — F4320 Adjustment disorder, unspecified: Secondary | ICD-10-CM | POA: Diagnosis not present

## 2018-05-28 DIAGNOSIS — F4320 Adjustment disorder, unspecified: Secondary | ICD-10-CM | POA: Diagnosis not present

## 2018-06-04 ENCOUNTER — Other Ambulatory Visit: Payer: Self-pay | Admitting: Internal Medicine

## 2018-06-04 ENCOUNTER — Other Ambulatory Visit: Payer: Self-pay | Admitting: Adult Health Nurse Practitioner

## 2018-06-04 DIAGNOSIS — Z1231 Encounter for screening mammogram for malignant neoplasm of breast: Secondary | ICD-10-CM

## 2018-06-05 DIAGNOSIS — R1012 Left upper quadrant pain: Secondary | ICD-10-CM | POA: Diagnosis not present

## 2018-06-05 DIAGNOSIS — K5900 Constipation, unspecified: Secondary | ICD-10-CM | POA: Diagnosis not present

## 2018-06-05 DIAGNOSIS — R1013 Epigastric pain: Secondary | ICD-10-CM | POA: Diagnosis not present

## 2018-06-05 DIAGNOSIS — R197 Diarrhea, unspecified: Secondary | ICD-10-CM | POA: Diagnosis not present

## 2018-06-12 DIAGNOSIS — F4320 Adjustment disorder, unspecified: Secondary | ICD-10-CM | POA: Diagnosis not present

## 2018-06-19 DIAGNOSIS — F4320 Adjustment disorder, unspecified: Secondary | ICD-10-CM | POA: Diagnosis not present

## 2018-06-30 DIAGNOSIS — F4320 Adjustment disorder, unspecified: Secondary | ICD-10-CM | POA: Diagnosis not present

## 2018-07-02 ENCOUNTER — Ambulatory Visit: Payer: BLUE CROSS/BLUE SHIELD

## 2018-07-07 DIAGNOSIS — F4320 Adjustment disorder, unspecified: Secondary | ICD-10-CM | POA: Diagnosis not present

## 2018-07-31 DIAGNOSIS — F4320 Adjustment disorder, unspecified: Secondary | ICD-10-CM | POA: Diagnosis not present

## 2018-08-10 DIAGNOSIS — M25562 Pain in left knee: Secondary | ICD-10-CM | POA: Diagnosis not present

## 2018-08-10 DIAGNOSIS — M25569 Pain in unspecified knee: Secondary | ICD-10-CM | POA: Insufficient documentation

## 2018-08-10 DIAGNOSIS — M25561 Pain in right knee: Secondary | ICD-10-CM | POA: Diagnosis not present

## 2018-08-11 DIAGNOSIS — F4320 Adjustment disorder, unspecified: Secondary | ICD-10-CM | POA: Diagnosis not present

## 2018-08-25 DIAGNOSIS — F4320 Adjustment disorder, unspecified: Secondary | ICD-10-CM | POA: Diagnosis not present

## 2018-09-01 ENCOUNTER — Ambulatory Visit: Payer: BLUE CROSS/BLUE SHIELD

## 2018-09-15 DIAGNOSIS — F4320 Adjustment disorder, unspecified: Secondary | ICD-10-CM | POA: Diagnosis not present

## 2018-09-18 DIAGNOSIS — Z6836 Body mass index (BMI) 36.0-36.9, adult: Secondary | ICD-10-CM | POA: Diagnosis not present

## 2018-09-18 DIAGNOSIS — E782 Mixed hyperlipidemia: Secondary | ICD-10-CM | POA: Diagnosis not present

## 2018-09-18 DIAGNOSIS — R7301 Impaired fasting glucose: Secondary | ICD-10-CM | POA: Diagnosis not present

## 2018-09-18 DIAGNOSIS — J019 Acute sinusitis, unspecified: Secondary | ICD-10-CM | POA: Diagnosis not present

## 2018-09-18 DIAGNOSIS — I10 Essential (primary) hypertension: Secondary | ICD-10-CM | POA: Diagnosis not present

## 2018-09-21 DIAGNOSIS — R7301 Impaired fasting glucose: Secondary | ICD-10-CM | POA: Diagnosis not present

## 2018-09-21 DIAGNOSIS — G4700 Insomnia, unspecified: Secondary | ICD-10-CM | POA: Diagnosis not present

## 2018-09-21 DIAGNOSIS — I10 Essential (primary) hypertension: Secondary | ICD-10-CM | POA: Diagnosis not present

## 2018-09-21 DIAGNOSIS — E782 Mixed hyperlipidemia: Secondary | ICD-10-CM | POA: Diagnosis not present

## 2018-09-22 DIAGNOSIS — F4320 Adjustment disorder, unspecified: Secondary | ICD-10-CM | POA: Diagnosis not present

## 2018-09-29 DIAGNOSIS — F4320 Adjustment disorder, unspecified: Secondary | ICD-10-CM | POA: Diagnosis not present

## 2018-10-16 ENCOUNTER — Ambulatory Visit
Admission: RE | Admit: 2018-10-16 | Discharge: 2018-10-16 | Disposition: A | Discharge status: Home / Self Care | Payer: BLUE CROSS/BLUE SHIELD | Source: Ambulatory Visit | Attending: Adult Health Nurse Practitioner | Admitting: Adult Health Nurse Practitioner

## 2018-10-16 DIAGNOSIS — Z1231 Encounter for screening mammogram for malignant neoplasm of breast: Secondary | ICD-10-CM

## 2018-10-20 DIAGNOSIS — F4320 Adjustment disorder, unspecified: Secondary | ICD-10-CM | POA: Diagnosis not present

## 2018-11-02 DIAGNOSIS — M25561 Pain in right knee: Secondary | ICD-10-CM | POA: Diagnosis not present

## 2018-11-02 DIAGNOSIS — M25562 Pain in left knee: Secondary | ICD-10-CM | POA: Diagnosis not present

## 2018-11-10 DIAGNOSIS — F4320 Adjustment disorder, unspecified: Secondary | ICD-10-CM | POA: Diagnosis not present

## 2018-11-17 DIAGNOSIS — Z6837 Body mass index (BMI) 37.0-37.9, adult: Secondary | ICD-10-CM | POA: Diagnosis not present

## 2018-11-17 DIAGNOSIS — E782 Mixed hyperlipidemia: Secondary | ICD-10-CM | POA: Diagnosis not present

## 2018-11-17 DIAGNOSIS — Z6836 Body mass index (BMI) 36.0-36.9, adult: Secondary | ICD-10-CM | POA: Diagnosis not present

## 2018-11-17 DIAGNOSIS — N390 Urinary tract infection, site not specified: Secondary | ICD-10-CM | POA: Diagnosis not present

## 2018-12-04 DIAGNOSIS — F4320 Adjustment disorder, unspecified: Secondary | ICD-10-CM | POA: Diagnosis not present

## 2019-02-10 DIAGNOSIS — F4320 Adjustment disorder, unspecified: Secondary | ICD-10-CM | POA: Diagnosis not present

## 2019-03-24 DIAGNOSIS — E782 Mixed hyperlipidemia: Secondary | ICD-10-CM | POA: Diagnosis not present

## 2019-03-24 DIAGNOSIS — I10 Essential (primary) hypertension: Secondary | ICD-10-CM | POA: Diagnosis not present

## 2019-03-24 DIAGNOSIS — R7301 Impaired fasting glucose: Secondary | ICD-10-CM | POA: Diagnosis not present

## 2019-03-31 DIAGNOSIS — I10 Essential (primary) hypertension: Secondary | ICD-10-CM | POA: Diagnosis not present

## 2019-03-31 DIAGNOSIS — R079 Chest pain, unspecified: Secondary | ICD-10-CM | POA: Diagnosis not present

## 2019-03-31 DIAGNOSIS — E782 Mixed hyperlipidemia: Secondary | ICD-10-CM | POA: Diagnosis not present

## 2019-03-31 DIAGNOSIS — R7303 Prediabetes: Secondary | ICD-10-CM | POA: Diagnosis not present

## 2019-03-31 DIAGNOSIS — R05 Cough: Secondary | ICD-10-CM | POA: Diagnosis not present

## 2019-04-09 DIAGNOSIS — R8290 Unspecified abnormal findings in urine: Secondary | ICD-10-CM | POA: Diagnosis not present

## 2019-04-09 DIAGNOSIS — M545 Low back pain: Secondary | ICD-10-CM | POA: Diagnosis not present

## 2019-05-17 ENCOUNTER — Other Ambulatory Visit: Payer: Self-pay

## 2019-05-17 ENCOUNTER — Other Ambulatory Visit: Payer: BLUE CROSS/BLUE SHIELD

## 2019-05-17 DIAGNOSIS — R6889 Other general symptoms and signs: Secondary | ICD-10-CM | POA: Diagnosis not present

## 2019-05-17 DIAGNOSIS — Z20822 Contact with and (suspected) exposure to covid-19: Secondary | ICD-10-CM

## 2019-05-18 LAB — NOVEL CORONAVIRUS, NAA: SARS-CoV-2, NAA: NOT DETECTED

## 2019-05-25 ENCOUNTER — Other Ambulatory Visit: Payer: Self-pay

## 2019-05-25 DIAGNOSIS — Z20822 Contact with and (suspected) exposure to covid-19: Secondary | ICD-10-CM

## 2019-05-26 LAB — NOVEL CORONAVIRUS, NAA: SARS-CoV-2, NAA: NOT DETECTED

## 2019-06-04 DIAGNOSIS — R197 Diarrhea, unspecified: Secondary | ICD-10-CM | POA: Diagnosis not present

## 2019-06-04 DIAGNOSIS — R1013 Epigastric pain: Secondary | ICD-10-CM | POA: Diagnosis not present

## 2019-06-04 DIAGNOSIS — K5900 Constipation, unspecified: Secondary | ICD-10-CM | POA: Diagnosis not present

## 2019-06-04 DIAGNOSIS — R109 Unspecified abdominal pain: Secondary | ICD-10-CM | POA: Diagnosis not present

## 2019-06-29 ENCOUNTER — Other Ambulatory Visit: Payer: Self-pay

## 2019-06-29 DIAGNOSIS — Z20822 Contact with and (suspected) exposure to covid-19: Secondary | ICD-10-CM

## 2019-06-30 LAB — NOVEL CORONAVIRUS, NAA: SARS-CoV-2, NAA: NOT DETECTED

## 2019-08-11 ENCOUNTER — Other Ambulatory Visit: Payer: Self-pay

## 2019-08-11 DIAGNOSIS — Z20822 Contact with and (suspected) exposure to covid-19: Secondary | ICD-10-CM

## 2019-08-12 LAB — NOVEL CORONAVIRUS, NAA: SARS-CoV-2, NAA: NOT DETECTED

## 2019-09-02 DIAGNOSIS — L821 Other seborrheic keratosis: Secondary | ICD-10-CM | POA: Diagnosis not present

## 2019-09-02 DIAGNOSIS — D229 Melanocytic nevi, unspecified: Secondary | ICD-10-CM | POA: Diagnosis not present

## 2019-09-29 ENCOUNTER — Other Ambulatory Visit: Payer: Self-pay

## 2019-09-29 ENCOUNTER — Ambulatory Visit: Payer: BC Managed Care – PPO | Attending: Internal Medicine

## 2019-09-29 ENCOUNTER — Other Ambulatory Visit: Payer: BLUE CROSS/BLUE SHIELD

## 2019-09-29 DIAGNOSIS — Z20822 Contact with and (suspected) exposure to covid-19: Secondary | ICD-10-CM

## 2019-09-29 DIAGNOSIS — Z20828 Contact with and (suspected) exposure to other viral communicable diseases: Secondary | ICD-10-CM | POA: Diagnosis not present

## 2019-10-01 LAB — NOVEL CORONAVIRUS, NAA: SARS-CoV-2, NAA: NOT DETECTED

## 2019-10-18 ENCOUNTER — Other Ambulatory Visit: Payer: Self-pay | Admitting: Adult Health Nurse Practitioner

## 2019-10-18 DIAGNOSIS — Z1231 Encounter for screening mammogram for malignant neoplasm of breast: Secondary | ICD-10-CM

## 2019-10-22 ENCOUNTER — Ambulatory Visit
Admission: RE | Admit: 2019-10-22 | Discharge: 2019-10-22 | Disposition: A | Discharge status: Home / Self Care | Payer: 59 | Source: Ambulatory Visit | Attending: Adult Health Nurse Practitioner | Admitting: Adult Health Nurse Practitioner

## 2019-10-22 ENCOUNTER — Other Ambulatory Visit: Payer: Self-pay

## 2019-10-22 DIAGNOSIS — Z1231 Encounter for screening mammogram for malignant neoplasm of breast: Secondary | ICD-10-CM

## 2019-11-01 ENCOUNTER — Ambulatory Visit: Payer: 59 | Attending: Internal Medicine

## 2019-11-01 ENCOUNTER — Other Ambulatory Visit: Payer: Self-pay

## 2019-11-01 DIAGNOSIS — Z20822 Contact with and (suspected) exposure to covid-19: Secondary | ICD-10-CM

## 2019-11-02 LAB — NOVEL CORONAVIRUS, NAA: SARS-CoV-2, NAA: NOT DETECTED

## 2019-11-09 ENCOUNTER — Other Ambulatory Visit: Payer: Self-pay

## 2019-11-09 ENCOUNTER — Ambulatory Visit: Payer: 59 | Attending: Internal Medicine

## 2019-11-09 DIAGNOSIS — Z20822 Contact with and (suspected) exposure to covid-19: Secondary | ICD-10-CM | POA: Insufficient documentation

## 2019-11-10 LAB — NOVEL CORONAVIRUS, NAA: SARS-CoV-2, NAA: NOT DETECTED

## 2019-12-23 ENCOUNTER — Other Ambulatory Visit: Payer: 59

## 2019-12-24 ENCOUNTER — Ambulatory Visit: Payer: 59 | Attending: Internal Medicine

## 2019-12-24 ENCOUNTER — Other Ambulatory Visit: Payer: Self-pay

## 2019-12-24 DIAGNOSIS — Z20822 Contact with and (suspected) exposure to covid-19: Secondary | ICD-10-CM

## 2019-12-25 LAB — NOVEL CORONAVIRUS, NAA: SARS-CoV-2, NAA: NOT DETECTED

## 2020-01-06 ENCOUNTER — Other Ambulatory Visit (HOSPITAL_COMMUNITY): Payer: Self-pay | Admitting: Orthopedic Surgery

## 2020-01-06 ENCOUNTER — Other Ambulatory Visit: Payer: Self-pay | Admitting: Orthopedic Surgery

## 2020-01-06 DIAGNOSIS — M25561 Pain in right knee: Secondary | ICD-10-CM

## 2020-01-10 ENCOUNTER — Other Ambulatory Visit: Payer: Self-pay

## 2020-01-10 ENCOUNTER — Ambulatory Visit: Payer: 59 | Attending: Internal Medicine

## 2020-01-10 DIAGNOSIS — Z20822 Contact with and (suspected) exposure to covid-19: Secondary | ICD-10-CM

## 2020-01-12 LAB — SARS-COV-2, NAA 2 DAY TAT

## 2020-01-12 LAB — NOVEL CORONAVIRUS, NAA: SARS-CoV-2, NAA: NOT DETECTED

## 2020-01-27 ENCOUNTER — Other Ambulatory Visit: Payer: Self-pay

## 2020-01-27 ENCOUNTER — Ambulatory Visit: Payer: 59 | Attending: Internal Medicine

## 2020-01-27 DIAGNOSIS — Z20822 Contact with and (suspected) exposure to covid-19: Secondary | ICD-10-CM

## 2020-01-28 LAB — NOVEL CORONAVIRUS, NAA: SARS-CoV-2, NAA: NOT DETECTED

## 2020-01-28 LAB — SARS-COV-2, NAA 2 DAY TAT

## 2020-02-09 ENCOUNTER — Ambulatory Visit (HOSPITAL_COMMUNITY)
Admission: RE | Admit: 2020-02-09 | Discharge: 2020-02-09 | Disposition: A | Discharge status: Home / Self Care | Payer: 59 | Source: Ambulatory Visit | Attending: Orthopedic Surgery | Admitting: Orthopedic Surgery

## 2020-02-09 ENCOUNTER — Other Ambulatory Visit: Payer: Self-pay

## 2020-02-09 DIAGNOSIS — M25561 Pain in right knee: Secondary | ICD-10-CM | POA: Insufficient documentation

## 2020-06-01 ENCOUNTER — Other Ambulatory Visit: Payer: 59

## 2020-09-22 ENCOUNTER — Other Ambulatory Visit: Payer: Self-pay | Admitting: Adult Health Nurse Practitioner

## 2020-09-22 DIAGNOSIS — Z1231 Encounter for screening mammogram for malignant neoplasm of breast: Secondary | ICD-10-CM

## 2020-10-14 DIAGNOSIS — C4491 Basal cell carcinoma of skin, unspecified: Secondary | ICD-10-CM

## 2020-10-14 HISTORY — DX: Basal cell carcinoma of skin, unspecified: C44.91

## 2020-11-07 ENCOUNTER — Ambulatory Visit
Admission: RE | Admit: 2020-11-07 | Discharge: 2020-11-07 | Disposition: A | Discharge status: Home / Self Care | Payer: 59 | Source: Ambulatory Visit | Attending: Adult Health Nurse Practitioner | Admitting: Adult Health Nurse Practitioner

## 2020-11-07 ENCOUNTER — Other Ambulatory Visit: Payer: Self-pay

## 2020-11-07 DIAGNOSIS — Z1231 Encounter for screening mammogram for malignant neoplasm of breast: Secondary | ICD-10-CM

## 2020-11-22 ENCOUNTER — Ambulatory Visit: Payer: 59 | Admitting: Physician Assistant

## 2020-11-22 ENCOUNTER — Encounter: Payer: Self-pay | Admitting: Physician Assistant

## 2020-11-22 ENCOUNTER — Other Ambulatory Visit: Payer: Self-pay

## 2020-11-22 DIAGNOSIS — D485 Neoplasm of uncertain behavior of skin: Secondary | ICD-10-CM

## 2020-11-22 DIAGNOSIS — L820 Inflamed seborrheic keratosis: Secondary | ICD-10-CM

## 2020-11-22 DIAGNOSIS — C44311 Basal cell carcinoma of skin of nose: Secondary | ICD-10-CM

## 2020-11-22 DIAGNOSIS — Z1283 Encounter for screening for malignant neoplasm of skin: Secondary | ICD-10-CM | POA: Diagnosis not present

## 2020-11-22 DIAGNOSIS — L570 Actinic keratosis: Secondary | ICD-10-CM

## 2020-11-22 DIAGNOSIS — L821 Other seborrheic keratosis: Secondary | ICD-10-CM

## 2020-11-22 NOTE — Patient Instructions (Signed)
Biopsy, Surgery (Curettage) & Surgery (Excision) Aftercare Instructions  1. Okay to remove bandage in 24 hours  2. Wash area with soap and water  3. Apply Vaseline to area twice daily until healed (Not Neosporin)  4. Okay to cover with a Band-Aid to decrease the chance of infection or prevent irritation from clothing; also it's okay to uncover lesion at home.  5. Suture instructions: return to our office in 7-10 or 10-14 days for a nurse visit for suture removal. Variable healing with sutures, if pain or itching occurs call our office. It's okay to shower or bathe 24 hours after sutures are given.  6. The following risks may occur after a biopsy, curettage or excision: bleeding, scarring, discoloration, recurrence, infection (redness, yellow drainage, pain or swelling).  7. For questions, concerns and results call our office at [place] before 4pm & Friday before 3pm. Biopsy results will be available in 1 week.    Mohs Surgery Mohs surgery is a procedure used to treat skin cancer. It is often used to treat common types of skin cancer, such as basal cell carcinoma and squamous cell carcinoma. In this procedure, cancerous skin cells are carefully cut away layer by layer. The goal is to remove all cancerous tissue and leave healthy skin. This reduces scarring and allows for a better cosmetic outcome. Mohs surgery is used to treat skin cancer in areas where it is important to save as much of the normal skin as possible. These areas include the face, nose, ears, lips, and genitals. This procedure may be done if:  Your skin cancer has returned after another type of treatment was done.  The cancer is likely to return.  The cancerous area is large.  The cancerous area has edges that are not clearly defined.  The cancer is growing rapidly. Tell a health care provider about:  Any allergies you have.  All medicines you are taking, including vitamins, herbs, eye drops, creams, and  over-the-counter medicines.  Any problems you or family members have had with anesthetic medicines.  Any blood disorders you have.  Any surgeries you have had.  Any medical conditions you have.  Whether you are pregnant or may be pregnant. What are the risks? Generally, this is a safe procedure. However, problems may occur, including:  Infection.  Bleeding.  Allergic reactions to medicines.  Damage to other structures, such as nerves. What happens before the procedure?  Ask your health care provider about: ? Changing or stopping your regular medicines. This is especially important if you are taking diabetes medicines or blood thinners. ? Taking medicines such as aspirin and ibuprofen. These medicines can thin your blood. Do not take these medicines unless your health care provider tells you to take them. ? Taking over-the-counter medicines, vitamins, herbs, and supplements.  Ask your health care provider how your surgical site will be marked or identified.  Ask your health care provider what steps will be taken to help prevent infection. These may include: ? Removing hair at the surgery site. ? Washing skin with a germ-killing soap. ? Taking antibiotic medicine. What happens during the procedure?  You will be given a medicine to numb the area (local anesthetic).  A layer of cancerous tissue will be removed with a scalpel. The layer removed will contain a small amount of the healthy tissue surrounding the cancerous tissue.  The layer of removed tissue will be checked right away under a microscope. The surgeon will note the exact location of the cancerous cells.  Another layer of tissue may be removed from an area with any remaining cancerous cells. This layer will be checked in the same way.  More layers of cancerous tissue may be removed, one by one, and checked until no signs of cancer remain.  Depending on the size and location of the surgical wound, it may be closed  with stitches (sutures) or left open to heal on its own. In some cases, a skin flap or skin graft may be needed.  A bandage (dressing) will be applied to the area. The procedure may vary among health care providers and hospitals.   What happens after the procedure?  Return to your normal activities as told by your health care provider. Summary  Mohs surgery is a procedure used to treat skin cancer on the face, ears, nose, lips, and genitals. It removes the cancerous cells while leaving as much healthy skin as possible.  Generally, this is a safe procedure. However, problems may occur, including infection, bleeding, and damage to other structures, such as nerves.  Follow your health care provider's instructions before the procedure. You may be asked to change or stop certain medicines.  After the procedure, you may return to your normal activities as told by your health care provider. This information is not intended to replace advice given to you by your health care provider. Make sure you discuss any questions you have with your health care provider. Document Revised: 04/28/2018 Document Reviewed: 04/28/2018 Elsevier Patient Education  2021 [employer]vier Inc[employer]   

## 2020-11-30 ENCOUNTER — Telehealth: Payer: Self-pay

## 2020-11-30 NOTE — Telephone Encounter (Signed)
-----   Message from Warren Danes, [place] sent at 11/29/2020  5:11 PM EST ----- mohs

## 2020-11-30 NOTE — Telephone Encounter (Signed)
Release:  [id number] Done today for skin surgery

## 2020-11-30 NOTE — Telephone Encounter (Signed)
Path to patient MOHS is next step.

## 2020-12-18 ENCOUNTER — Encounter: Payer: Self-pay | Admitting: Physician Assistant

## 2020-12-18 NOTE — Progress Notes (Addendum)
   Follow-Up Visit   Subjective  Stacy Russell is a 50 y.o. female who presents for the following: Annual Exam (RIGHT NOSE BLOOD BLISTER X6 MO).   The following portions of the chart were reviewed this encounter and updated as appropriate:  Tobacco  Allergies  Meds  Problems  Med Hx  Surg Hx  Fam Hx      Objective  Well appearing patient in no apparent distress; mood and affect are within normal limits.  A full examination was performed including scalp, head, eyes, ears, nose, lips, neck, chest, axillae, abdomen, back, buttocks, bilateral upper extremities, bilateral lower extremities, hands, feet, fingers, toes, fingernails, and toenails. All findings within normal limits unless otherwise noted below.  Objective  Left Superior Helix, Left Upper Back (2), Right Shoulder - Posterior: Erythematous patches with gritty scale.  Objective  Right Nasal Sidewall: Pearly papule with telangectasia.        Objective  Waist Up: Waist up exam today no signs of atypical moles, or melanoma  Objective  Right Buccal Cheek (2): Stuck-on, waxy papules and plaques.   Assessment & Plan  AK (actinic keratosis) (4) Right Shoulder - Posterior; Left Upper Back (2); Left Superior Helix  Destruction of lesion - Left Superior Helix, Left Upper Back, Right Shoulder - Posterior Complexity: simple   Destruction method: cryotherapy   Informed consent: discussed and consent obtained   Timeout:  patient name, date of birth, surgical site, and procedure verified Lesion destroyed using liquid nitrogen: Yes   Cryotherapy cycles:  3 Outcome: patient tolerated procedure well with no complications   Post-procedure details: wound care instructions given    Neoplasm of uncertain behavior of skin Right Nasal Sidewall  Skin / nail biopsy Type of biopsy: tangential   Informed consent: discussed and consent obtained   Timeout: patient name, date of birth, surgical site, and procedure verified    Procedure prep:  Patient was prepped and draped in usual sterile fashion (Non sterile) Prep type:  Chlorhexidine Anesthesia: the lesion was anesthetized in a standard fashion   Anesthetic:  1% lidocaine w/ epinephrine 1-100,000 local infiltration Instrument used: flexible razor blade   Hemostasis achieved with: aluminum chloride and electrodesiccation   Outcome: patient tolerated procedure well   Post-procedure details: sterile dressing applied and wound care instructions given   Dressing type: bandage and petrolatum    Specimen 1 - Surgical pathology Differential Diagnosis: R/O BCC vs SCC  Check Margins: No  Cautery after biopsy  Skin exam for malignant neoplasm Waist Up  Yearly skin check  Seborrheic keratosis, inflamed (2) Right Buccal Cheek  Destruction of lesion - Right Buccal Cheek Complexity: simple   Destruction method: cryotherapy   Informed consent: discussed and consent obtained   Timeout:  patient name, date of birth, surgical site, and procedure verified Lesion destroyed using liquid nitrogen: Yes   Cryotherapy cycles:  3 Outcome: patient tolerated procedure well with no complications   Post-procedure details: wound care instructions given     I, SHEFFIELD,KELLI, PA-C, have reviewed all documentation's for this visit.  The documentation on 01/11/21 for the exam, diagnosis, procedures and orders are all accurate and complete.

## 2021-04-29 ENCOUNTER — Encounter: Payer: Self-pay | Admitting: Emergency Medicine

## 2021-04-29 ENCOUNTER — Ambulatory Visit
Admission: EM | Admit: 2021-04-29 | Discharge: 2021-04-29 | Disposition: A | Discharge status: Home / Self Care | Payer: 59 | Attending: Family Medicine | Admitting: Family Medicine

## 2021-04-29 DIAGNOSIS — M5442 Lumbago with sciatica, left side: Secondary | ICD-10-CM | POA: Diagnosis present

## 2021-04-29 DIAGNOSIS — R102 Pelvic and perineal pain: Secondary | ICD-10-CM | POA: Diagnosis not present

## 2021-04-29 LAB — POCT URINALYSIS DIP (MANUAL ENTRY)
Bilirubin, UA: NEGATIVE
Glucose, UA: NEGATIVE mg/dL
Ketones, POC UA: NEGATIVE mg/dL
Nitrite, UA: POSITIVE — AB
Protein Ur, POC: NEGATIVE mg/dL
Spec Grav, UA: 1.025 (ref 1.010–1.025)
Urobilinogen, UA: 0.2 E.U./dL
pH, UA: 5 (ref 5.0–8.0)

## 2021-04-29 MED ORDER — METHOCARBAMOL 500 MG PO TABS: 500.0000 mg | ORAL_TABLET | Freq: Two times a day (BID) | ORAL | 0 refills | Status: AC

## 2021-04-29 MED ORDER — KETOROLAC TROMETHAMINE 60 MG/2ML IM SOLN
60.0000 mg | Freq: Once | INTRAMUSCULAR | Status: AC
  Administered 2021-04-29: 60 mg via INTRAMUSCULAR

## 2021-04-29 MED ORDER — DEXAMETHASONE SODIUM PHOSPHATE 10 MG/ML IJ SOLN
10.0000 mg | Freq: Once | INTRAMUSCULAR | Status: AC
  Administered 2021-04-29: 10 mg via INTRAMUSCULAR

## 2021-04-29 MED ORDER — DICLOFENAC SODIUM 50 MG PO TBEC
50.0000 mg | DELAYED_RELEASE_TABLET | Freq: Two times a day (BID) | ORAL | 0 refills | Status: AC
Start: 2021-04-29 — End: ?

## 2021-04-29 NOTE — Discharge Instructions (Addendum)
Today avoid taking any naproxen, ibuprofen or aspirin.  For pain today you may take methocarbamol or Tylenol.  Tomorrow you can start the diclofenac 50 mg twice daily as needed to reduce inflammation associated with pain and continue methocarbamol for acute pain.  If symptoms do not improve follow-up with Dr. Aline Brochure at orthopedic. Urine culture is pending if any bacteria is present on culture findings we will send medication for treatment of a UTI.  In the meantime hydrate well with fluids.

## 2021-04-29 NOTE — ED Provider Notes (Signed)
[facility]    CSN: [id number] Arrival date & time: 04/29/21  0808      History   Chief Complaint Chief Complaint  Patient presents with   Back Pain    HPI Stacy Russell is a 50 y.o. female.   HPI Patient here today with back pain.  She reports pain started yesterday she applied heat, took Motrin, tramadol and applied ice without any relief of pain.  She endorses dark urine along with dysuria x1 incident yesterday however this has since resolved.  She has taken AZO as however her back pain has continued.  She has had back pain in the past and last year received a steroid injection which relieved pain up until recently.  Endorses doing some housework however is unaware of any activities which would have precipitated back pain. 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   Left ankle sprain 02/10/2014   Fracture of tibia, distal, left, open 02/08/2014   Open fracture of right fibula and tibia 02/07/2014   Essential hypertension, benign 03/18/2013   Back pain 03/18/2013    Past Surgical History:  Procedure Laterality Date   ABDOMINAL HYSTERECTOMY     APPENDECTOMY     CHOLECYSTECTOMY     COLON SURGERY     DIAGNOSTIC LAPAROSCOPY     DILATION AND CURETTAGE OF UTERUS     FEMUR FRACTURE SURGERY     INCISION AND DRAINAGE OF WOUND Right 02/07/2014   Procedure: IRRIGATION right lower leg wound;   Surgeon: Mauri Pole, MD;  Location: [facility];  Service: Orthopedics;  Laterality: Right;   LAPAROSCOPIC SIGMOID COLECTOMY N/A 06/05/2015   Procedure: LAPAROSCOPIC ASSISTED LOW ANTERIOR RESECTION;  Surgeon: Excell Seltzer, MD;  Location: [facility];  Service: General;  Laterality: N/A;   laproscopic removal of scar tissue     TIBIA IM NAIL INSERTION Right 02/07/2014   Procedure: INTRAMEDULLARY (IM) NAIL Right TIBIA;  Surgeon: Mauri Pole, MD;  Location: [facility];  Service: Orthopedics;  Laterality: Right;    OB History   No obstetric history on file.      Home Medications    Prior to Admission medications   Medication Sig Start Date End Date Taking? Authorizing Provider  docusate sodium 100 MG CAPS Take 100 mg by mouth 2 (two) times daily. Patient not taking: No sig reported 02/10/14   Benedetto Goad, PA-C  ibuprofen (ADVIL) 200 MG tablet Take 400 mg by mouth every 4 (four) hours as needed for headache.    [provider]  lisinopril-hydrochlorothiazide (PRINZIDE,ZESTORETIC) 20-12.5 MG per tablet Take 1 tablet by mouth every morning.    [provider]  omeprazole (PRILOSEC) 40 MG capsule Take 40 mg by mouth daily.    [provider]  oxyCODONE (OXY IR/ROXICODONE) 5 MG immediate release tablet Take 1-3 tablets (5-15 mg total) by mouth every 4 (four) hours as needed for moderate pain or severe pain. Patient not taking: No sig reported 02/10/14   Benedetto Goad, PA-C  oxyCODONE 10 MG TABS Take 1 tablet (10 mg total) by mouth every 4 (four) hours as needed for moderate pain. Patient not taking: Reported on 11/22/2020 06/08/15   Excell Seltzer, MD  sertraline (ZOLOFT) 50 MG tablet Take 50 mg by mouth at bedtime.     [provider]  zolpidem (AMBIEN) 10 MG tablet Take 5 mg by mouth at bedtime as needed for sleep.     [provider]    Family History Family History  Problem Relation Age of Onset   Hyperlipidemia Mother    Hypertension Maternal  Aunt    Hypertension Maternal Uncle    Hypertension Paternal Aunt    Hypertension Paternal Uncle    Cancer Other        breast and colon   Heart attack Other    Breast cancer Other    Diabetes Other    Heart attack Other     Social History Social History   Tobacco Use   Smoking status: Never   Smokeless tobacco: Never  Vaping Use   Vaping Use: Never used  Substance Use Topics   Alcohol use: No   Drug use: No     Allergies   Codeine and Dilaudid [hydromorphone hcl]   Review of Systems Review of Systems Pertinent negatives listed in HPI  Physical Exam Triage Vital Signs ED Triage Vitals [04/29/21 0823]  Enc Vitals Group     BP 135/84     Pulse Rate 82     Resp 18     Temp 98.3 F (36.8 C)     Temp Source Oral     SpO2 95 %     Weight      Height      Head Circumference      Peak Flow      Pain Score 8     Pain Loc      Pain Edu?      Excl. in [place]?    No data found.  Updated Vital Signs BP 135/84 (BP Location: Right Arm)   Pulse 82   Temp 98.3 F (36.8 C) (Oral)   Resp 18   SpO2 95%   Visual Acuity Right Eye Distance:   Left Eye Distance:   Bilateral Distance:    Right Eye Near:   Left Eye Near:    Bilateral Near:     Physical Exam General appearance: alert, well developed, well nourished, cooperative  Head: Normocephalic, without obvious abnormality, atraumatic Respiratory: Respirations even and unlabored, normal respiratory rate Heart: rate and rhythm normal. No gallop or murmurs noted on exam  Abdomen: BS +, no distention, no rebound tenderness, or no mass Back : Positive SLR right , no spinous deformity, no spinous tenderness, no spinous bowel  Skin: Skin color, texture, turgor normal. No rashes seen  Psych: Appropriate mood and affect. Neurologic: No focal neurological deficit  UC Treatments / Results  Labs (all labs ordered are listed, but only abnormal results are displayed) Labs Reviewed  POCT URINALYSIS DIP (MANUAL ENTRY)     EKG   Radiology No results found.  Procedures Procedures (including critical care time)  Medications Ordered in UC Medications - No data to display  Initial Impression / Assessment and Plan /  UC Course  I have reviewed the triage vital signs and the nursing notes.  Pertinent labs & imaging results that were available during my care of the patient were reviewed by me and considered in my medical decision making (see chart for details).  Low back pain with sciatica Treatment today with Decadron and Toradol Home management with Robaxin and tomorrow she will start diclofenac as needed Low pelvic pressure UA contaminated with AZO's.  Urine culture pending. Will notify patient of any abnormal results. 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 Provider   methocarbamol (ROBAXIN) 500 MG tablet Take 1 tablet (500 mg total) by mouth 2 (two) times daily. 20 tablet Scot Jun, FNP   diclofenac (VOLTAREN) 50 MG EC tablet Take 1 tablet (50 mg total) by mouth 2 (two) times daily. 20 tablet Scot Jun, FNP      PDMP not reviewed this encounter.   Scot Jun, [place] 04/29/21 [phone]

## 2021-04-29 NOTE — ED Triage Notes (Addendum)
Lower back pain that started yesterday while cleaning.  Trying motrin, tramadol, head and ice with no relief.  Pt reports she has had dark urine and had burning once.  Pt took azo

## 2021-04-30 LAB — URINE CULTURE
Culture: NO GROWTH
Special Requests: NORMAL

## 2021-06-28 ENCOUNTER — Ambulatory Visit: Payer: 59

## 2021-06-28 ENCOUNTER — Ambulatory Visit: Payer: 59 | Admitting: Orthopedic Surgery

## 2021-06-28 ENCOUNTER — Other Ambulatory Visit: Payer: Self-pay

## 2021-06-28 ENCOUNTER — Encounter: Payer: Self-pay | Admitting: Orthopedic Surgery

## 2021-06-28 VITALS — BP 152/111 | HR 90 | Ht 70.0 in | Wt 265.0 lb

## 2021-06-28 DIAGNOSIS — M545 Low back pain, unspecified: Secondary | ICD-10-CM | POA: Diagnosis not present

## 2021-06-28 DIAGNOSIS — N951 Menopausal and female climacteric states: Secondary | ICD-10-CM | POA: Insufficient documentation

## 2021-06-28 NOTE — Progress Notes (Signed)
Chief Complaint  Patient presents with   Back Pain    New patient to the practice she is 50 years old she has had several bouts of back pain over the last 4 to 5 years the most recent 1 requiring a visit to urgent care.  Her treatment over the years has been a combination of anti-inflammatories injection with steroid plus or minus muscle relaxers  She even saw a chiropractor 1 time  Today she is actually fine but on her recent bout of back pain it was brought on by bending over to plug in a vacuum cleaner she had severe pain she had to miss work she is concerned because the back pain and the episodes are occurring more frequently  Review of systems is pretty negative she does complains of some intermittent night sweats and heartburn and some diarrhea occasional shortness of breath chest pain which is going to be worked up with cardiology and her primary care is aware when she has the back pain she will have some tingling in the left hip and above the knee into the thigh but nothing more than that she also has some tremor every now and then   Past Medical History:  Diagnosis Date   Colitis    Complication of anesthesia    Dysrhythmia    pt states hard to describe but states occas feels like heart is racing    Fall    hx of breaking right tibia and femur   GERD (gastroesophageal reflux disease)    occas experiences CP''s with it    Hyperlipidemia    Hypertension    IBS (irritable bowel syndrome)    Pain and swelling of right lower leg    Pain in joint involving right lower leg    PONV (postoperative nausea and vomiting)    Reflux    Venous stasis    Past Surgical History:  Procedure Laterality Date   ABDOMINAL HYSTERECTOMY     APPENDECTOMY     CHOLECYSTECTOMY     COLON SURGERY     DIAGNOSTIC LAPAROSCOPY     DILATION AND CURETTAGE OF UTERUS     FEMUR FRACTURE SURGERY     INCISION AND DRAINAGE OF WOUND Right 02/07/2014   Procedure: IRRIGATION right lower leg wound;  Surgeon:  Mauri Pole, MD;  Location: [facility];  Service: Orthopedics;  Laterality: Right;   LAPAROSCOPIC SIGMOID COLECTOMY N/A 06/05/2015   Procedure: LAPAROSCOPIC ASSISTED LOW ANTERIOR RESECTION;  Surgeon: Excell Seltzer, MD;  Location: [facility];  Service: General;  Laterality: N/A;   laproscopic removal of scar tissue     TIBIA IM NAIL INSERTION Right 02/07/2014   Procedure: INTRAMEDULLARY (IM) NAIL Right TIBIA;  Surgeon: Mauri Pole, MD;  Location: [facility];  Service: Orthopedics;  Laterality: Right;    Current Outpatient Medications:    diclofenac (VOLTAREN) 50 MG EC tablet, Take 1 tablet (50 mg total) by mouth 2 (two) times daily., Disp: 20 tablet, Rfl: 0   docusate sodium 100 MG CAPS, Take 100 mg by mouth 2 (two) times daily., Disp: 30 capsule, Rfl: 0   ibuprofen (ADVIL) 200 MG tablet, Take 400 mg by mouth every 4 (four) hours as needed for headache., Disp: , Rfl:    lisinopril-hydrochlorothiazide (PRINZIDE,ZESTORETIC) 20-12.5 MG per tablet, Take 1 tablet by mouth every morning., Disp: , Rfl:    methocarbamol (ROBAXIN) 500 MG tablet, Take 1 tablet (500 mg total) by mouth 2 (two) times daily., Disp: 20 tablet, Rfl: 0  omeprazole (PRILOSEC) 40 MG capsule, Take 40 mg by mouth daily., Disp: , Rfl:    sertraline (ZOLOFT) 50 MG tablet, Take 50 mg by mouth at bedtime. , Disp: , Rfl:    zolpidem (AMBIEN) 10 MG tablet, Take 5 mg by mouth at bedtime as needed for sleep. , Disp: , Rfl:  Social History   Tobacco Use   Smoking status: Never   Smokeless tobacco: Never  Vaping Use   Vaping Use: Never used  Substance Use Topics   Alcohol use: No   Drug use: No   Family History  Problem Relation Age of Onset   Hyperlipidemia Mother    Hypertension Maternal Aunt    Hypertension Maternal Uncle    Hypertension Paternal Aunt    Hypertension Paternal Uncle    Cancer Other        breast and colon   Heart attack Other    Breast cancer Other    Diabetes Other    Heart attack Other     BP (!) 152/111    Pulse 90   Ht '5\' 10"'$  (1.778 m)   Wt 265 lb (120.2 kg)   BMI 38.02 kg/m   She is awake and alert she is oriented x3 mood and affect are normal she is overweight her grooming and hygiene are excellent her appearance is normal  Cardiovascular exam pulses and perfusion are normal in both lower extremities  Skin is normal in both legs  Lumbar spine exam no tenderness or pain to palpation no spasms noted  Both lower extremities have normal hip exams with full range of motion without pain and no groin symptoms  The right leg has a scar from a previous IM nail it was an open fracture the open wound was distally and has healed nicely there is no sign of infection  She has no straight leg raise pain at all  Her x-ray is normal except for some truncal asymmetry in the coronal plane which I suspect is due to to her most recent back pain episode and is some residual muscle spasm  We had a long conversation if she has further pain she will call us to get medication plus or minus steroid injection if I am not in the office she will go to urgent care for treatment  No's need to do an MRI at this point as she is not having any symptoms  Proper lifting mechanics are recommended and patient education via handouts were also given to the patient

## 2021-07-20 ENCOUNTER — Ambulatory Visit: Payer: 59 | Admitting: Cardiology

## 2021-08-23 ENCOUNTER — Ambulatory Visit: Payer: 59 | Admitting: Cardiology

## 2021-08-23 ENCOUNTER — Encounter: Payer: Self-pay | Admitting: Cardiology

## 2021-08-23 VITALS — BP 124/76 | HR 83 | Ht 70.0 in | Wt 275.0 lb

## 2021-08-23 DIAGNOSIS — E7800 Pure hypercholesterolemia, unspecified: Secondary | ICD-10-CM | POA: Diagnosis not present

## 2021-08-23 DIAGNOSIS — R072 Precordial pain: Secondary | ICD-10-CM | POA: Diagnosis not present

## 2021-08-23 DIAGNOSIS — R079 Chest pain, unspecified: Secondary | ICD-10-CM | POA: Diagnosis not present

## 2021-08-23 DIAGNOSIS — I10 Essential (primary) hypertension: Secondary | ICD-10-CM | POA: Diagnosis not present

## 2021-08-23 MED ORDER — METOPROLOL TARTRATE 100 MG PO TABS: 100.0000 mg | ORAL_TABLET | ORAL | 0 refills | Status: AC

## 2021-08-23 NOTE — Patient Instructions (Signed)
Medication Instructions:  The current medical regimen is effective;  continue present plan and medications.  *If you need a refill on your cardiac medications before your next appointment, please call your pharmacy*  Testing/Procedures:   Your cardiac CT will be scheduled at:   [facility] [address] [place], [place] [address] [phone]  Please arrive at the [facility] main entrance (entrance A) of [facility] 30 minutes prior to test start time. You can use the FREE valet parking offered at the main entrance (encouraged to control the heart rate for the test) Proceed to the [facility] Radiology Department (first floor) to check-in and test prep.  Please follow these instructions carefully (unless otherwise directed):  On the Night Before the Test: Be sure to Drink plenty of water. Do not consume any caffeinated/decaffeinated beverages or chocolate 12 hours prior to your test. Do not take any antihistamines 12 hours prior to your test.  On the Day of the Test: Drink plenty of water until 1 hour prior to the test. Do not eat any food 4 hours prior to the test. You may take your regular medications prior to the test.   Hold the Lisinopril the morning of your test. Take metoprolol (Lopressor) two hours prior to test. HOLD Furosemide/Hydrochlorothiazide morning of the test. FEMALES- please wear underwire-free bra if available, avoid dresses & tight clothing  After the Test: Drink plenty of water. After receiving IV contrast, you may experience a mild flushed feeling. This is normal. On occasion, you may experience a mild rash up to 24 hours after the test. This is not dangerous. If this occurs, you can take Benadryl 25 mg and increase your fluid intake. If you experience trouble breathing, this can be serious. If it is severe call 911 IMMEDIATELY. If it is mild, please call our office. If you take any of these medications: Glipizide/Metformin,  Avandament, Glucavance, please do not take 48 hours after completing test unless otherwise instructed.  Please allow 2-4 weeks for scheduling of routine cardiac CTs. Some insurance companies require a pre-authorization which may delay scheduling of this test.   For non-scheduling related questions, please contact the cardiac imaging nurse navigator should you have any questions/concerns: Marchia Bond, Cardiac Imaging Nurse Navigator Gordy Clement, Cardiac Imaging Nurse Navigator [facility] Direct Office Dial: [phone]   For scheduling needs, including cancellations and rescheduling, please call [place], [phone].  Follow-Up: At [facility], you and your health needs are our priority.  As part of our continuing mission to provide you with exceptional heart care, we have created designated Provider Care Teams.  These Care Teams include your primary Cardiologist (physician) and Advanced Practice Providers (APPs -  Physician Assistants and Nurse Practitioners) who all work together to provide you with the care you need, when you need it.  We recommend signing up for the patient portal called "MyChart".  Sign up information is provided on this After Visit Summary.  MyChart is used to connect with patients for Virtual Visits (Telemedicine).  Patients are able to view lab/test results, encounter notes, upcoming appointments, etc.  Non-urgent messages can be sent to your provider as well.   To learn more about what you can do with MyChart, go to [email or web address].    Your next appointment:   Follow up will be determined after the above testing has been completed.  Thank you for choosing [facility]!!

## 2021-08-23 NOTE — Progress Notes (Signed)
Cardiology Office Note:    Date:  08/23/2021   ID:  Stacy Russell, DOB 18-May-1971, MRN [id number]  PCP:  Manon Hilding, MD   [facility] Providers Cardiologist:  None     Referring MD: Manon Hilding, MD    History of Present Illness:    Stacy Russell is a 50 y.o. female here for the evaluation of angina, chest pain at the request of Dr. Consuello Masse. Pain in chest during stress at work, activity, fast pace. Kids transportation... Stairs to bedroom seemed harder for her. Knee's poor, leg break a few years ago, hard to exercise. SOB with exertion. Strange feelings with heart beat. Notices heart. Feels it.   ?stress test.   Never smoked Father had myelodysplasia - had an MI and AAA in the hospital.   EKG from outside office shows sinus rhythm no other significant abnormalities.  Personally reviewed.  Hemoglobin 14.2 creatinine 0.67 potassium 4.9 ALT 18 total cholesterol 244 triglycerides 165 LDL 169 HDL 45.  Hemoglobin A1c 6.1.  She has had trouble with GERD, hypertension insomnia low back pain-has seen orthopedic doctor note reviewed.  Currently taking Crestor 5 mg every other day started on June 17.  Past Medical History:  Diagnosis Date   Colitis    Complication of anesthesia    Dysrhythmia    pt states hard to describe but states occas feels like heart is racing    Fall    hx of breaking right tibia and femur   GERD (gastroesophageal reflux disease)    occas experiences CP''s with it    Hyperlipidemia    Hypertension    IBS (irritable bowel syndrome)    Pain and swelling of right lower leg    Pain in joint involving right lower leg    PONV (postoperative nausea and vomiting)    Reflux    Venous stasis     Past Surgical History:  Procedure Laterality Date   ABDOMINAL HYSTERECTOMY     APPENDECTOMY     CHOLECYSTECTOMY     COLON SURGERY     DIAGNOSTIC LAPAROSCOPY     DILATION AND CURETTAGE OF UTERUS     FEMUR FRACTURE SURGERY     INCISION AND DRAINAGE OF  WOUND Right 02/07/2014   Procedure: IRRIGATION right lower leg wound;  Surgeon: Mauri Pole, MD;  Location: [place];  Service: Orthopedics;  Laterality: Right;   LAPAROSCOPIC SIGMOID COLECTOMY N/A 06/05/2015   Procedure: LAPAROSCOPIC ASSISTED LOW ANTERIOR RESECTION;  Surgeon: Excell Seltzer, MD;  Location: [facility];  Service: General;  Laterality: N/A;   laproscopic removal of scar tissue     TIBIA IM NAIL INSERTION Right 02/07/2014   Procedure: INTRAMEDULLARY (IM) NAIL Right TIBIA;  Surgeon: Mauri Pole, MD;  Location: [place];  Service: Orthopedics;  Laterality: Right;    Current Medications: Current Meds  Medication Sig   diclofenac (VOLTAREN) 50 MG EC tablet Take 1 tablet (50 mg total) by mouth 2 (two) times daily.   docusate sodium 100 MG CAPS Take 100 mg by mouth 2 (two) times daily.   ibuprofen (ADVIL) 200 MG tablet Take 400 mg by mouth every 4 (four) hours as needed for headache.   lisinopril-hydrochlorothiazide (PRINZIDE,ZESTORETIC) 20-12.5 MG per tablet Take 1 tablet by mouth every morning.   methocarbamol (ROBAXIN) 500 MG tablet Take 1 tablet (500 mg total) by mouth 2 (two) times daily.   metoprolol tartrate (LOPRESSOR) 100 MG tablet Take 1 tablet (100 mg total) by mouth as directed.  omeprazole (PRILOSEC) 40 MG capsule Take 40 mg by mouth daily.   rosuvastatin (CRESTOR) 10 MG tablet    sertraline (ZOLOFT) 50 MG tablet Take 50 mg by mouth at bedtime.    zolpidem (AMBIEN) 10 MG tablet Take 5 mg by mouth at bedtime as needed for sleep.      Allergies:   Codeine and Dilaudid [hydromorphone hcl]   Social History   Socioeconomic History   Marital status: Married    Spouse name: Not on file   Number of children: Not on file   Years of education: Not on file   Highest education level: Not on file  Occupational History   Not on file  Tobacco Use   Smoking status: Never   Smokeless tobacco: Never  Vaping Use   Vaping Use: Never used  Substance and Sexual Activity    Alcohol use: No   Drug use: No   Sexual activity: Not on file  Other Topics Concern   Not on file  Social History Narrative   Not on file   Social Determinants of Health   Financial Resource Strain: Not on file  Food Insecurity: Not on file  Transportation Needs: Not on file  Physical Activity: Not on file  Stress: Not on file  Social Connections: Not on file     Family History: The patient's family history includes Breast cancer in an other family member; Cancer in an other family member; Diabetes in an other family member; Heart attack in some other family members; Hyperlipidemia in her mother; Hypertension in her maternal aunt, maternal uncle, paternal aunt, and paternal uncle.  ROS:   Please see the history of present illness.    No syncope. Sometimes dizzy and sweat in Summer.  All other systems reviewed and are negative.  EKGs/Labs/Other Studies Reviewed:    The following studies were reviewed today: Outside office notes, Ortho notes, lab work, EKG  EKG: Prior EKG as above.  Heart rate was 96 bpm.  No acute changes.  Recent Labs: No results found for requested labs within last 8760 hours.  Recent Lipid Panel    Component Value Date/Time   CHOL 227 (H) 07/05/2013 0757   TRIG 168 (H) 07/05/2013 0757   HDL 47 07/05/2013 0757   CHOLHDL 4.8 07/05/2013 0757   VLDL 34 07/05/2013 0757   LDLCALC 146 (H) 07/05/2013 0757     Risk Assessment/Calculations:          Physical Exam:    VS:  BP 124/76   Pulse 83   Ht 5\' 10"  (1.778 m)   Wt 275 lb (124.7 kg)   SpO2 97%   BMI 39.46 kg/m     Wt Readings from Last 3 Encounters:  08/23/21 275 lb (124.7 kg)  06/28/21 265 lb (120.2 kg)  06/05/15 244 lb (110.7 kg)     GEN:  Well nourished, well developed in no acute distress HEENT: Normal NECK: No JVD; No carotid bruits LYMPHATICS: No lymphadenopathy CARDIAC: RRR, no murmurs, rubs, gallops RESPIRATORY:  Clear to auscultation without rales, wheezing or rhonchi   ABDOMEN: Soft, non-tender, non-distended MUSCULOSKELETAL:  No edema; No deformity  SKIN: Warm and dry NEUROLOGIC:  Alert and oriented x 3 PSYCHIATRIC:  Normal affect   ASSESSMENT:    1. Precordial pain   2. Chest pain of uncertain etiology   3. Essential hypertension, benign   4. Pure hypercholesterolemia    PLAN:    In order of problems listed above:  Chest pain of uncertain  etiology Chest pain noted.  Possible etiologies include cardiac, GI with GERD.  Given her age 27, father with myocardial infarction later in his life associated with myelodysplasia, hypertension, hyperlipidemia we will go ahead and check a coronary CT scan with possible FFR analysis.  Heart rate was 83 today.  We will give her metoprolol 100 mg prior to scan.  She can hold her lisinopril prior.  This will be very helpful in plaque analysis.  This will also help guide [place] on Crestor therapy.  She was a little bit hesitant to take the Crestor previously.  Essential hypertension, benign Well-controlled today on current lisinopril hydrochlorothiazide 20/12.5 that she takes every morning.  Excellent.  Pure hypercholesterolemia Currently on Crestor 5 mg every other day low-dose.  We may need to adjust the dose if we do see evidence of coronary plaque present on CT scan.         Medication Adjustments/Labs and Tests Ordered: Current medicines are reviewed at length with the patient today.  Concerns regarding medicines are outlined above.  Orders Placed This Encounter  Procedures   CT CORONARY MORPH W/CTA COR W/SCORE W/CA W/CM &/OR WO/CM    Meds ordered this encounter  Medications   metoprolol tartrate (LOPRESSOR) 100 MG tablet    Sig: Take 1 tablet (100 mg total) by mouth as directed.    Dispense:  1 tablet    Refill:  0     Patient Instructions  Medication Instructions:  The current medical regimen is effective;  continue present plan and medications.  *If you need a refill on your cardiac  medications before your next appointment, please call your pharmacy*  Testing/Procedures:   Your cardiac CT will be scheduled at:   [facility] [address] [place], [place] [address] [phone]  Please arrive at the [facility] main entrance (entrance A) of [facility] 30 minutes prior to test start time. You can use the FREE valet parking offered at the main entrance (encouraged to control the heart rate for the test) Proceed to the [facility] Radiology Department (first floor) to check-in and test prep.  Please follow these instructions carefully (unless otherwise directed):  On the Night Before the Test: Be sure to Drink plenty of water. Do not consume any caffeinated/decaffeinated beverages or chocolate 12 hours prior to your test. Do not take any antihistamines 12 hours prior to your test.  On the Day of the Test: Drink plenty of water until 1 hour prior to the test. Do not eat any food 4 hours prior to the test. You may take your regular medications prior to the test.   Hold the Lisinopril the morning of your test. Take metoprolol (Lopressor) two hours prior to test. HOLD Furosemide/Hydrochlorothiazide morning of the test. FEMALES- please wear underwire-free bra if available, avoid dresses & tight clothing  After the Test: Drink plenty of water. After receiving IV contrast, you may experience a mild flushed feeling. This is normal. On occasion, you may experience a mild rash up to 24 hours after the test. This is not dangerous. If this occurs, you can take Benadryl 25 mg and increase your fluid intake. If you experience trouble breathing, this can be serious. If it is severe call 911 IMMEDIATELY. If it is mild, please call our office. If you take any of these medications: Glipizide/Metformin, Avandament, Glucavance, please do not take 48 hours after completing test unless otherwise instructed.  Please allow 2-4 weeks for scheduling of routine  cardiac  CTs. Some insurance companies require a pre-authorization which may delay scheduling of this test.   For non-scheduling related questions, please contact the cardiac imaging nurse navigator should you have any questions/concerns: Marchia Bond, Cardiac Imaging Nurse Navigator Gordy Clement, Cardiac Imaging Nurse Navigator [facility] Direct Office Dial: [phone]   For scheduling needs, including cancellations and rescheduling, please call [place], [phone].  Follow-Up: At [facility], you and your health needs are our priority.  As part of our continuing mission to provide you with exceptional heart care, we have created designated Provider Care Teams.  These Care Teams include your primary Cardiologist (physician) and Advanced Practice Providers (APPs -  Physician Assistants and Nurse Practitioners) who all work together to provide you with the care you need, when you need it.  We recommend signing up for the patient portal called "MyChart".  Sign up information is provided on this After Visit Summary.  MyChart is used to connect with patients for Virtual Visits (Telemedicine).  Patients are able to view lab/test results, encounter notes, upcoming appointments, etc.  Non-urgent messages can be sent to your provider as well.   To learn more about what you can do with MyChart, go to [email or web address].    Your next appointment:   Follow up will be determined after the above testing has been completed.  Thank you for choosing [facility]!!     Signed, Candee Furbish, MD  08/23/2021 10:23 AM    [place]

## 2021-08-23 NOTE — Assessment & Plan Note (Signed)
Well-controlled today on current lisinopril hydrochlorothiazide 20/12.5 that she takes every morning.  Excellent.

## 2021-08-23 NOTE — Assessment & Plan Note (Signed)
Currently on Crestor 5 mg every other day low-dose.  We may need to adjust the dose if we do see evidence of coronary plaque present on CT scan.

## 2021-08-23 NOTE — Assessment & Plan Note (Addendum)
Chest pain noted.  Possible etiologies include cardiac, GI with GERD.  Given her age 50, father with myocardial infarction later in his life associated with myelodysplasia, hypertension, hyperlipidemia we will go ahead and check a coronary CT scan with possible FFR analysis.  Heart rate was 83 today.  We will give her metoprolol 100 mg prior to scan.  She can hold her lisinopril prior.  This will be very helpful in plaque analysis.  This will also help guide [place] on Crestor therapy.  She was a little bit hesitant to take the Crestor previously.

## 2021-08-28 ENCOUNTER — Other Ambulatory Visit (HOSPITAL_COMMUNITY): Payer: Self-pay | Admitting: *Deleted

## 2021-08-28 DIAGNOSIS — Z01812 Encounter for preprocedural laboratory examination: Secondary | ICD-10-CM

## 2021-08-29 ENCOUNTER — Telehealth (HOSPITAL_COMMUNITY): Payer: Self-pay | Admitting: *Deleted

## 2021-08-29 NOTE — Telephone Encounter (Signed)
Reaching out to patient to offer assistance regarding upcoming cardiac imaging study; pt verbalizes understanding of appt date/time, parking situation and where to check in, pre-test NPO status and medications ordered, and verified current allergies; name and call back number provided for further questions should they arise  Gordy Clement RN [facility] [phone] office [phone] cell  Patient unable to obtain blood work prior to appointment but is agreeable to doing an I-stat on day of test.  She is to take 100mg  metoprolol tartrate two hours prior to cardiac CT and is aware to arrive by 2:45pm for her 3:15pm scan.

## 2021-08-31 ENCOUNTER — Ambulatory Visit (HOSPITAL_COMMUNITY)
Admission: RE | Admit: 2021-08-31 | Discharge: 2021-08-31 | Disposition: A | Discharge status: Home / Self Care | Payer: 59 | Source: Ambulatory Visit | Attending: Cardiology | Admitting: Cardiology

## 2021-08-31 ENCOUNTER — Other Ambulatory Visit: Payer: Self-pay

## 2021-08-31 DIAGNOSIS — R072 Precordial pain: Secondary | ICD-10-CM | POA: Insufficient documentation

## 2021-08-31 DIAGNOSIS — I10 Essential (primary) hypertension: Secondary | ICD-10-CM | POA: Diagnosis present

## 2021-08-31 LAB — POCT I-STAT CREATININE: Creatinine, Ser: 0.6 mg/dL (ref 0.44–1.00)

## 2021-08-31 MED ORDER — NITROGLYCERIN 0.4 MG SL SUBL: 0.8000 mg | SUBLINGUAL_TABLET | Freq: Once | SUBLINGUAL | Status: AC

## 2021-08-31 MED ORDER — NITROGLYCERIN 0.4 MG SL SUBL
SUBLINGUAL_TABLET | SUBLINGUAL | Status: AC
  Administered 2021-08-31: 0.8 mg via SUBLINGUAL
  Filled 2021-08-31: qty 2

## 2021-08-31 MED ORDER — METOPROLOL TARTRATE 5 MG/5ML IV SOLN
INTRAVENOUS | Status: AC
  Administered 2021-08-31: 10 mg via INTRAVENOUS
  Filled 2021-08-31: qty 20

## 2021-08-31 MED ORDER — METOPROLOL TARTRATE 5 MG/5ML IV SOLN
10.0000 mg | INTRAVENOUS | Status: AC | PRN
Start: 2021-08-31 — End: 2021-08-31
  Administered 2021-08-31: 10 mg via INTRAVENOUS

## 2021-08-31 MED ORDER — IOHEXOL 350 MG/ML SOLN
100.0000 mL | Freq: Once | INTRAVENOUS | Status: AC | PRN
  Administered 2021-08-31: 100 mL via INTRAVENOUS

## 2021-08-31 MED ORDER — DILTIAZEM HCL 25 MG/5ML IV SOLN
INTRAVENOUS | Status: AC
  Filled 2021-08-31: qty 5

## 2021-09-24 ENCOUNTER — Other Ambulatory Visit: Payer: Self-pay | Admitting: Family Medicine

## 2021-09-24 DIAGNOSIS — Z1231 Encounter for screening mammogram for malignant neoplasm of breast: Secondary | ICD-10-CM

## 2021-11-07 ENCOUNTER — Other Ambulatory Visit: Payer: Self-pay

## 2021-11-07 ENCOUNTER — Ambulatory Visit: Payer: 59 | Admitting: Physician Assistant

## 2021-11-07 ENCOUNTER — Encounter: Payer: Self-pay | Admitting: Physician Assistant

## 2021-11-07 DIAGNOSIS — L570 Actinic keratosis: Secondary | ICD-10-CM

## 2021-11-07 DIAGNOSIS — D485 Neoplasm of uncertain behavior of skin: Secondary | ICD-10-CM

## 2021-11-07 DIAGNOSIS — Z85828 Personal history of other malignant neoplasm of skin: Secondary | ICD-10-CM

## 2021-11-07 DIAGNOSIS — L820 Inflamed seborrheic keratosis: Secondary | ICD-10-CM

## 2021-11-07 DIAGNOSIS — Z1283 Encounter for screening for malignant neoplasm of skin: Secondary | ICD-10-CM

## 2021-11-07 NOTE — Patient Instructions (Signed)

## 2021-11-09 ENCOUNTER — Encounter: Payer: Self-pay | Admitting: Physician Assistant

## 2021-11-09 ENCOUNTER — Ambulatory Visit: Payer: 59

## 2021-11-09 NOTE — Progress Notes (Signed)
° °  Follow-Up Visit   Subjective  Stacy Russell is a 51 y.o. 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 Chest crusty lesion that itches and bleeds tx neosporin ).   The following portions of the chart were reviewed this encounter and updated as appropriate:  Tobacco   Allergies   Meds   Problems   Med Hx   Surg Hx   Fam Hx       Objective  Well appearing patient in no apparent distress; mood and affect are within normal limits.  A full examination was performed including scalp, head, eyes, ears, nose, lips, neck, chest, axillae, abdomen, back, buttocks, bilateral upper extremities, bilateral lower extremities, hands, feet, fingers, toes, fingernails, and toenails. All findings within normal limits unless otherwise noted below.  Mid Chest Superior Pearly papule with telangectasia.        Mid Chest Inferior Crusty plaque with telangectasia.         Assessment & Plan  Neoplasm of uncertain behavior of skin (2) Mid Chest Superior  Skin / nail biopsy Type of biopsy: tangential   Informed consent: discussed and consent obtained   Timeout: patient name, date of birth, surgical site, and procedure verified   Anesthesia: the lesion was anesthetized in a standard fashion   Anesthetic:  1% lidocaine w/ epinephrine 1-100,000 local infiltration Instrument used: flexible razor blade   Hemostasis achieved with: aluminum chloride and electrodesiccation   Outcome: patient tolerated procedure well   Post-procedure details: sterile dressing applied and wound care instructions given   Dressing type: bandage and petrolatum    Specimen 1 - Surgical pathology Differential Diagnosis: R/O BCC vs SCC - cautery  Check Margins: yes  Mid Chest Inferior  Skin / nail biopsy Type of biopsy: tangential   Informed consent: discussed and consent obtained   Timeout: patient name, date of birth,  surgical site, and procedure verified   Anesthesia: the lesion was anesthetized in a standard fashion   Anesthetic:  1% lidocaine w/ epinephrine 1-100,000 local infiltration Instrument used: flexible razor blade   Hemostasis achieved with: aluminum chloride   Outcome: patient tolerated procedure well   Post-procedure details: sterile dressing applied and wound care instructions given   Dressing type: bandage and petrolatum    Specimen 2 - Surgical pathology Differential Diagnosis: R/O BCC vs SCC  Check Margins: yes  No atypical nevi noted at the time of the visit.  I, Nadirah Socorro, PA-C, have reviewed all documentation's for this visit.  The documentation on 11/09/21 for the exam, diagnosis, procedures and orders are all accurate and complete.

## 2021-11-16 ENCOUNTER — Encounter: Payer: Self-pay | Admitting: Physician Assistant

## 2021-11-23 ENCOUNTER — Other Ambulatory Visit: Payer: Self-pay

## 2021-11-23 ENCOUNTER — Ambulatory Visit
Admission: RE | Admit: 2021-11-23 | Discharge: 2021-11-23 | Disposition: A | Discharge status: Home / Self Care | Payer: 59 | Source: Ambulatory Visit | Attending: Family Medicine | Admitting: Family Medicine

## 2021-11-23 DIAGNOSIS — Z1231 Encounter for screening mammogram for malignant neoplasm of breast: Secondary | ICD-10-CM

## 2022-01-31 ENCOUNTER — Ambulatory Visit: Payer: 59 | Admitting: Physician Assistant

## 2022-02-13 IMAGING — CT CT HEART MORP W/ CTA COR W/ SCORE W/ CA W/CM &/OR W/O CM
4 of 7 series · 8 of 20 positions shown, 9 images · IV contrast (APPLIED)
Comparison: None.
COMPARISON: None.

Addendum:
EXAM:
OVER-READ INTERPRETATION  CT CHEST

The following report is an over-read performed by radiologist Dr.
Casanova Agurcia [REDACTED] on 08/31/2021. This
over-read does not include interpretation of cardiac or coronary
anatomy or pathology. The coronary calcium score/coronary CTA
interpretation by the cardiologist is attached.
CLINICAL DATA: Chest pain
Cardiac/Coronary CTA
TECHNIQUE: A non-contrast, gated CT scan was obtained with axial slices of 3 mm
through the heart for calcium scoring. Calcium scoring was performed
using the Agatston method. A 120 kV prospective, gated, contrast
cardiac scan was obtained. Gantry rotation speed was 250 msecs and
collimation was 0.6 mm. Two sublingual nitroglycerin tablets (0.8
mg) were given. The 3D data set was reconstructed in 5% intervals of
the 35-75% of the R-R cycle. Diastolic phases were analyzed on a
dedicated workstation using MPR, MIP, and VRT modes. The patient
received 95 cc of contrast.

[Series 6: best diast 73 % · axial · 0.38mm/px · z∈[+1026,+1077]mm · 2 of 382 slices shown]
[im 128/382  vessel]
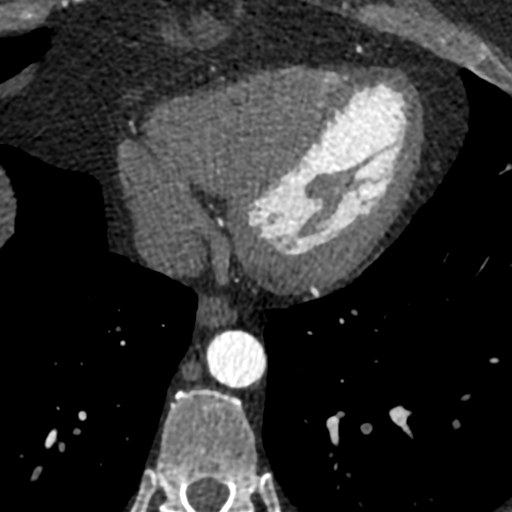
[im 255/382  vessel]
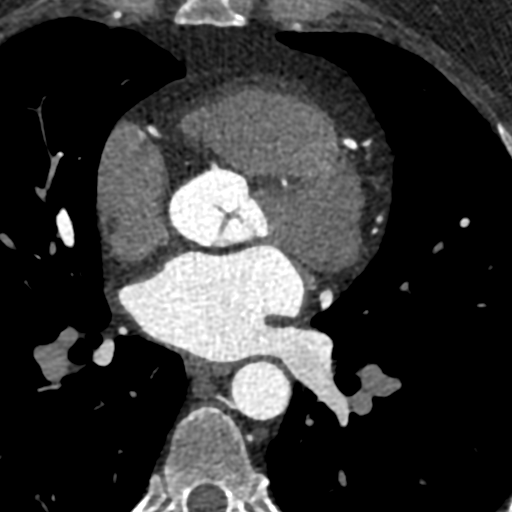

[Series 9: best syst · axial · 0.38mm/px · z∈[+1026,+1077]mm · 2 of 382 slices shown, 3 images]
[im 128/382  vessel]
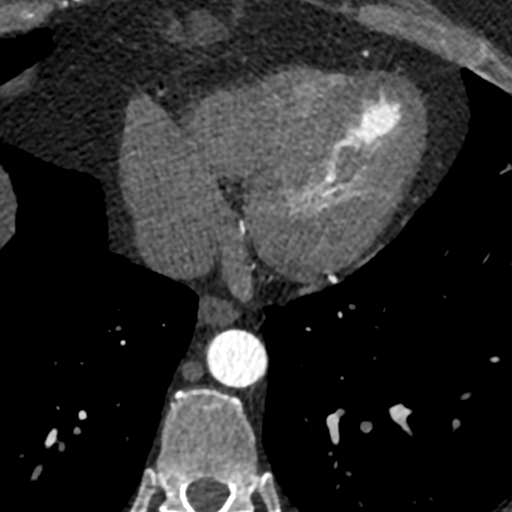
[im 128/382  lung]
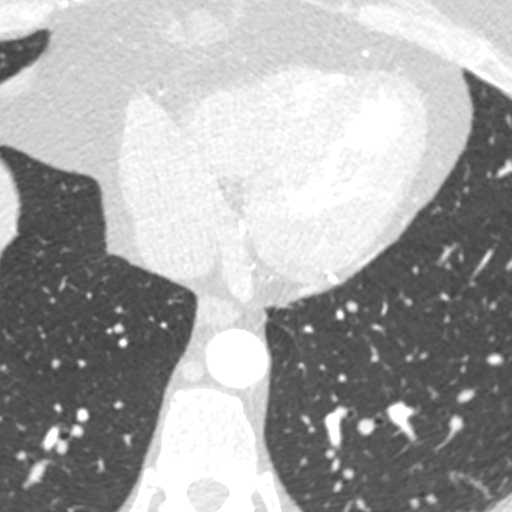
[im 255/382  vessel]
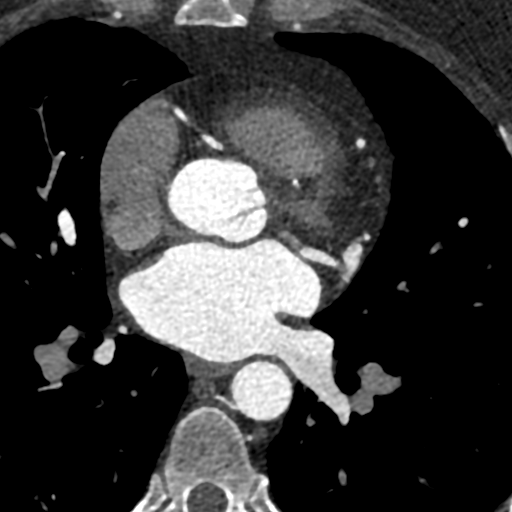

[Series 10: ts syst sharp · axial · 0.38mm/px · z∈[+1026,+1077]mm · 2 of 382 slices shown]
[im 128/382  lung]
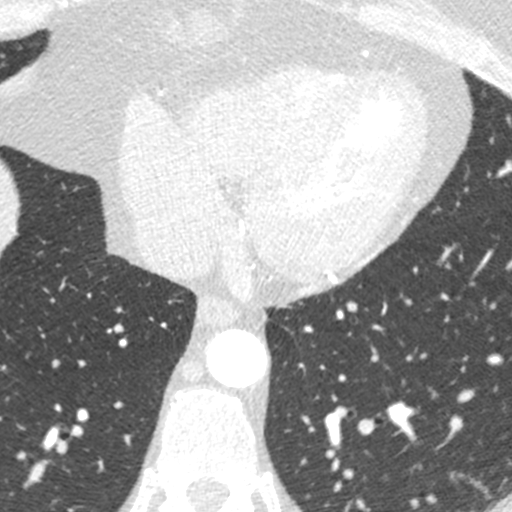
[im 255/382  lung]
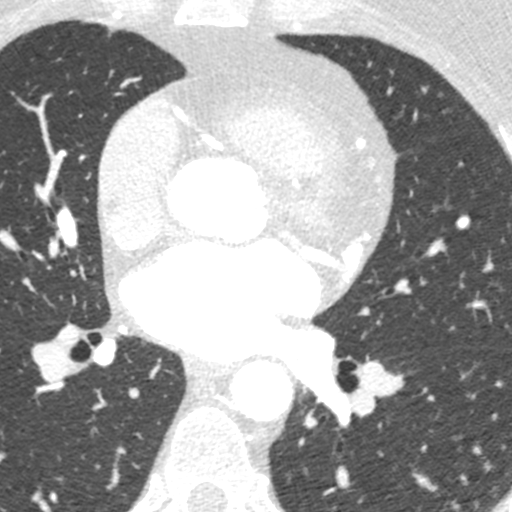

[Series 12: ts diast sharp · axial · 0.38mm/px · z∈[+1026,+1077]mm · 2 of 382 slices shown]
[im 128/382  lung]
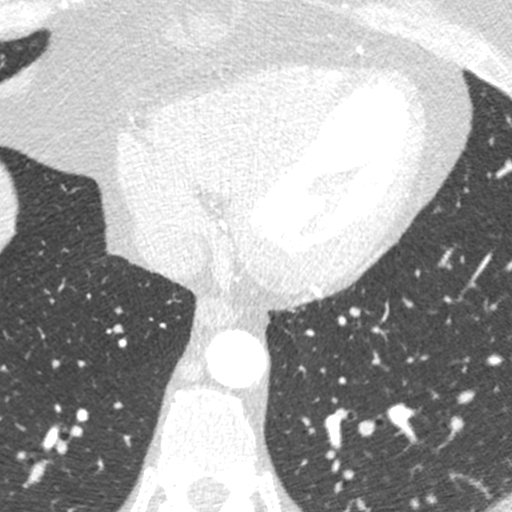
[im 255/382  lung]
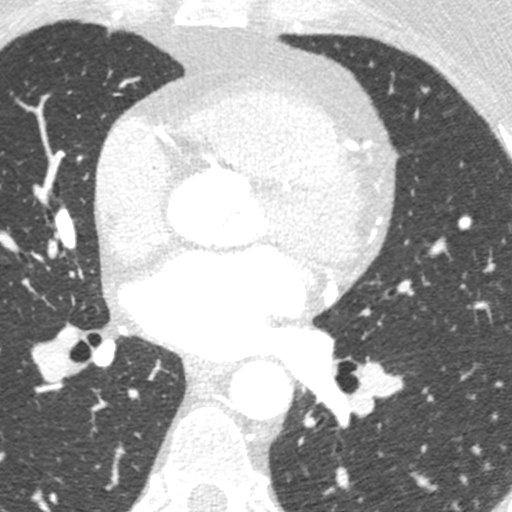

[8 of 20 positions shown; findings below may reference images not displayed]

FINDINGS: Within the visualized portions of the thorax there are no suspicious
appearing pulmonary nodules or masses, there is no acute
consolidative airspace disease, no pleural effusions, no
pneumothorax and no lymphadenopathy. Visualized portions of the
upper abdomen are unremarkable. There are no aggressive appearing
lytic or blastic lesions noted in the visualized portions of the
skeleton.
IMPRESSION: 1. No significant incidental noncardiac findings are noted.
FINDINGS: Image quality: Excellent.

Noise artifact is: Limited.

Coronary Arteries:  Normal coronary origin.  Left dominance.

Left main: The left main is a large caliber vessel with a normal
take off from the left coronary cusp that bifurcates into a LAD and
LCx. There is no plaque or stenosis.

Left anterior descending artery: The LAD is patent without evidence
of plaque or stenosis. The LAD gives off 2 patent diagonal branches.

Left circumflex artery: The LCX is dominant and patent with no
evidence of plaque or stenosis. The LCX gives off 2 patent obtuse
marginal branches and a PDA.

Right coronary artery: The RCA is non-dominant with normal take off
from the right coronary cusp. There is no evidence of plaque or
stenosis.

Right Atrium: Right atrial size is within normal limits.

Right Ventricle: The right ventricular cavity is within normal
limits.

Left Atrium: Left atrial size is normal in size with no left atrial
appendage filling defect.

Left Ventricle: The ventricular cavity size is within normal limits.
There are no stigmata of prior infarction. There is no abnormal
filling defect.

Pulmonary arteries: Normal in size without proximal filling defect.

Pulmonary veins: Normal pulmonary venous drainage.

Pericardium: Normal thickness with no significant effusion or
calcium present.

Cardiac valves: The aortic valve is trileaflet without significant
calcification. The mitral valve is normal structure without
significant calcification.

Aorta: Normal caliber with no significant disease.

Extra-cardiac findings: See attached radiology report for
non-cardiac structures.
IMPRESSION: 1. Coronary calcium score of 0. This was 0 percentile for age-, sex,
and race-matched controls.

2.  Normal coronary origin with Left dominance.

3.  Normal coronary arteries.  CAD RADS 0.

4.  Consider non cardiac causes for chest pain.

RECOMMENDATIONS:
1. CAD-RADS 0: No evidence of CAD (0%). Consider non-atherosclerotic
causes of chest pain.

2. CAD-RADS 1: Minimal non-obstructive CAD (0-24%). Consider
non-atherosclerotic causes of chest pain. Consider preventive
therapy and risk factor modification.

3. CAD-RADS 2: Mild non-obstructive CAD (25-49%). Consider
non-atherosclerotic causes of chest pain. Consider preventive
therapy and risk factor modification.

4. CAD-RADS 3: Moderate stenosis. Consider symptom-guided
anti-ischemic pharmacotherapy as well as risk factor modification
per guideline directed care. Additional analysis with CT FFR will be
submitted.

5. CAD-RADS 4: Severe stenosis. (70-99% or > 50% left main). Cardiac
catheterization or CT FFR is recommended. Consider symptom-guided
anti-ischemic pharmacotherapy as well as risk factor modification
per guideline directed care. Invasive coronary angiography
recommended with revascularization per published guideline
statements.

6. CAD-RADS 5: Total coronary occlusion (100%). Consider cardiac
catheterization or viability assessment. Consider symptom-guided
anti-ischemic pharmacotherapy as well as risk factor modification
per guideline directed care.

7. CAD-RADS N: Non-diagnostic study. Obstructive CAD can't be
excluded. Alternative evaluation is recommended.

*** End of Addendum ***
EXAM:
OVER-READ INTERPRETATION  CT CHEST

The following report is an over-read performed by radiologist Dr.
Casanova Agurcia [REDACTED] on 08/31/2021. This
over-read does not include interpretation of cardiac or coronary
anatomy or pathology. The coronary calcium score/coronary CTA
interpretation by the cardiologist is attached.
FINDINGS: Within the visualized portions of the thorax there are no suspicious
appearing pulmonary nodules or masses, there is no acute
consolidative airspace disease, no pleural effusions, no
pneumothorax and no lymphadenopathy. Visualized portions of the
upper abdomen are unremarkable. There are no aggressive appearing
lytic or blastic lesions noted in the visualized portions of the
skeleton.
IMPRESSION: 1. No significant incidental noncardiac findings are noted.

## 2022-02-18 ENCOUNTER — Other Ambulatory Visit: Payer: Self-pay | Admitting: Physician Assistant

## 2022-02-18 ENCOUNTER — Ambulatory Visit (HOSPITAL_COMMUNITY)
Admission: RE | Admit: 2022-02-18 | Discharge: 2022-02-18 | Disposition: A | Discharge status: Home / Self Care | Payer: 59 | Source: Ambulatory Visit | Attending: Physician Assistant | Admitting: Physician Assistant

## 2022-02-18 ENCOUNTER — Encounter (HOSPITAL_COMMUNITY): Payer: Self-pay

## 2022-02-18 ENCOUNTER — Other Ambulatory Visit (HOSPITAL_COMMUNITY): Payer: Self-pay | Admitting: Physician Assistant

## 2022-02-18 DIAGNOSIS — R0602 Shortness of breath: Secondary | ICD-10-CM | POA: Diagnosis present

## 2022-02-18 DIAGNOSIS — R000 Tachycardia, unspecified: Secondary | ICD-10-CM | POA: Insufficient documentation

## 2022-02-18 MED ORDER — IOHEXOL 350 MG/ML SOLN
100.0000 mL | Freq: Once | INTRAVENOUS | Status: AC | PRN
  Administered 2022-02-18: 75 mL via INTRAVENOUS

## 2022-08-03 IMAGING — CT CT ANGIO CHEST
2 of 8 series · 18 of 46 positions shown · IV contrast (Omnipaque or Isovue)
Comparison: December 16, 2013.

CLINICAL DATA: Cough, shortness of breath.

EXAM:
CT ANGIOGRAPHY CHEST WITH CONTRAST
TECHNIQUE: Multidetector CT imaging of the chest was performed using the
standard protocol during bolus administration of intravenous
contrast. Multiplanar CT image reconstructions and MIPs were
obtained to evaluate the vascular anatomy.

[Series 5: pe axial thins · axial · 0.80mm/px · z∈[+1293,+1574]mm · 15 of 398 slices shown]
[im 23/398  lung]
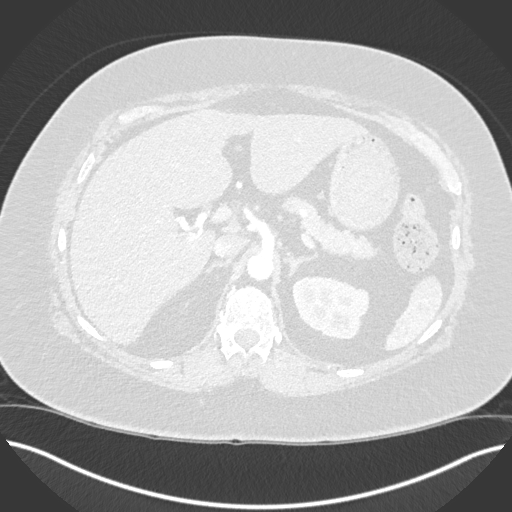
[im 45/398  soft-tissue]
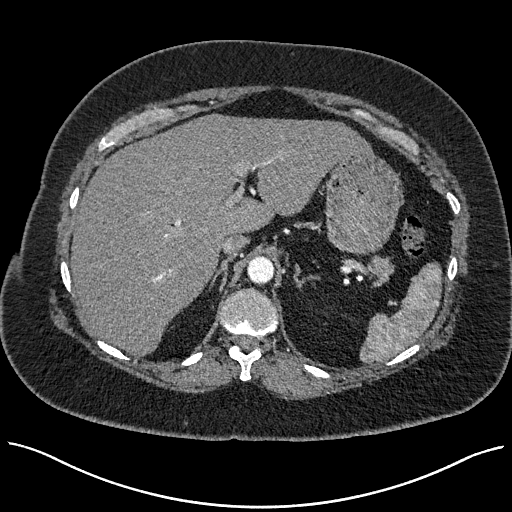
[im 67/398  lung]
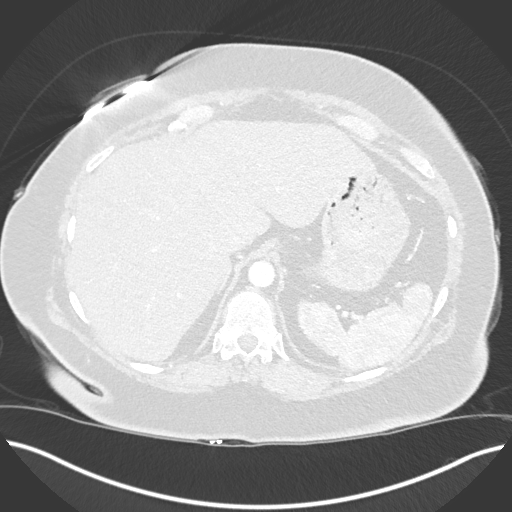
[im 89/398  soft-tissue]
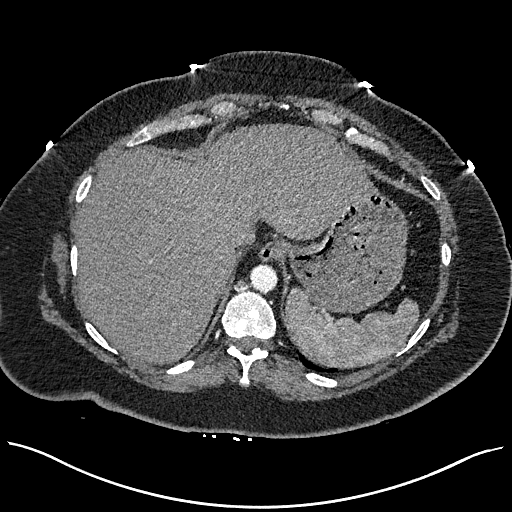
[im 133/398  lung]
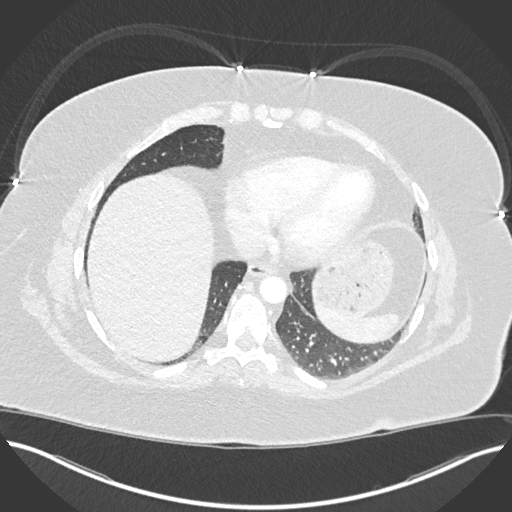
[im 155/398  soft-tissue]
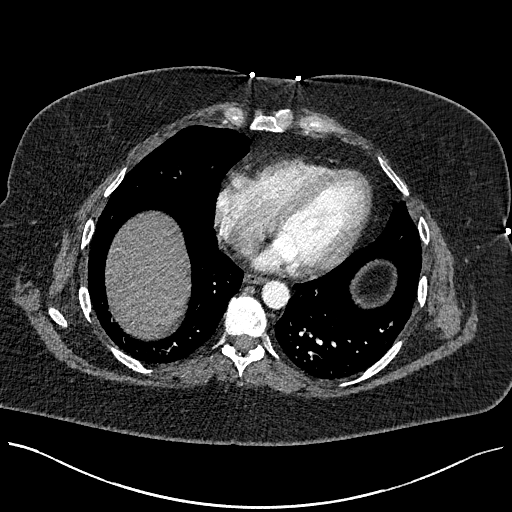
[im 177/398  lung]
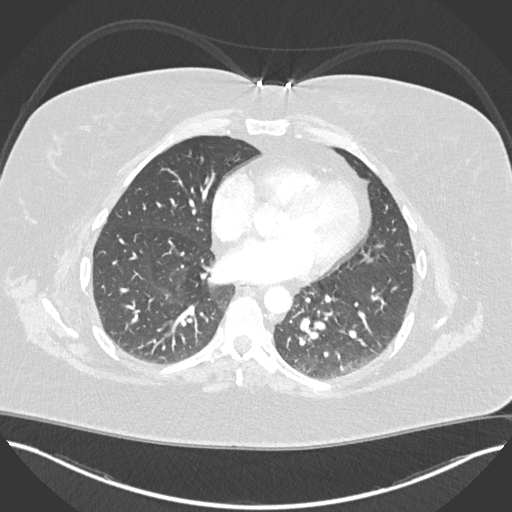
[im 199/398  soft-tissue]
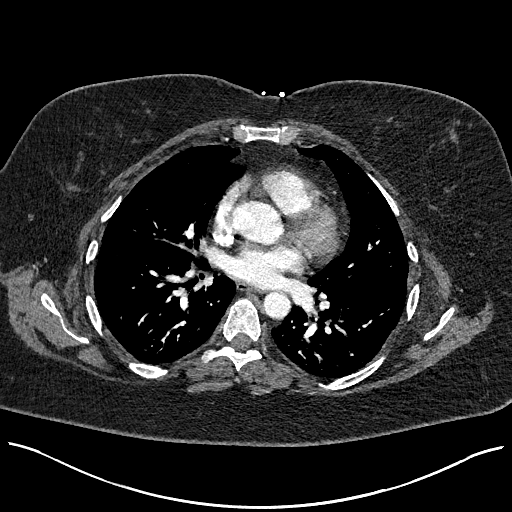
[im 221/398  lung]
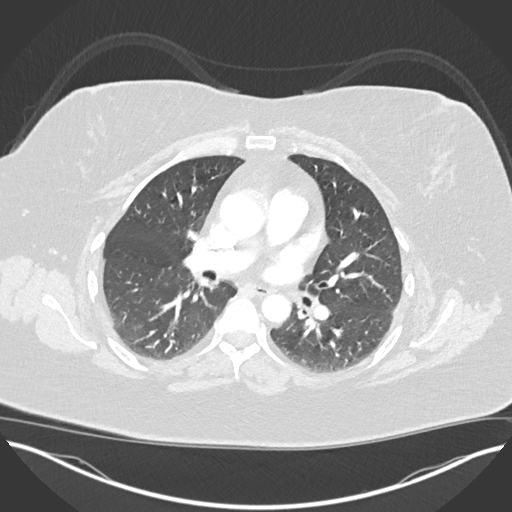
[im 243/398  soft-tissue]
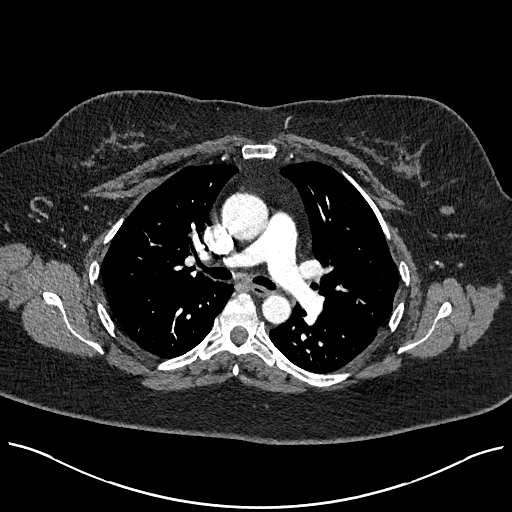
[im 265/398  lung]
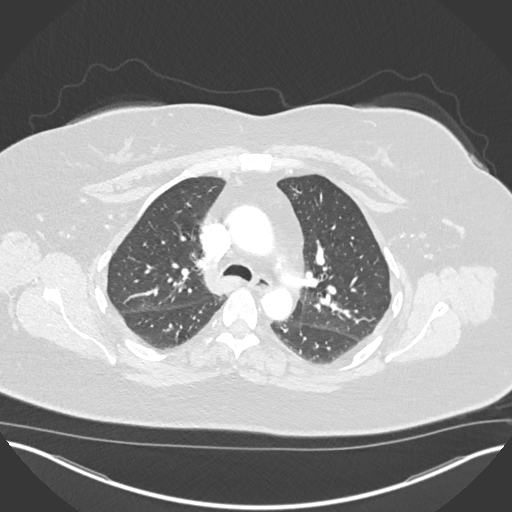
[im 309/398  soft-tissue]
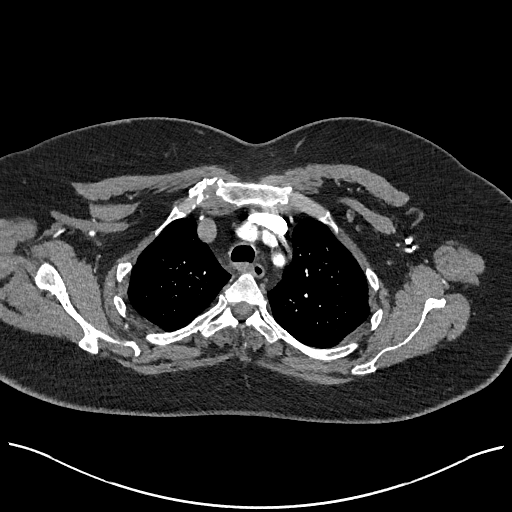
[im 331/398  lung]
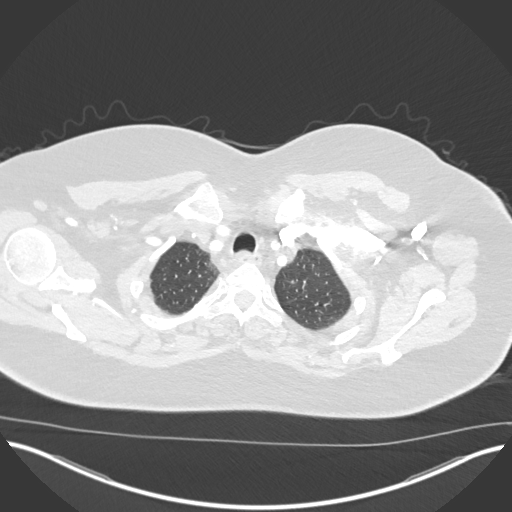
[im 353/398  soft-tissue]
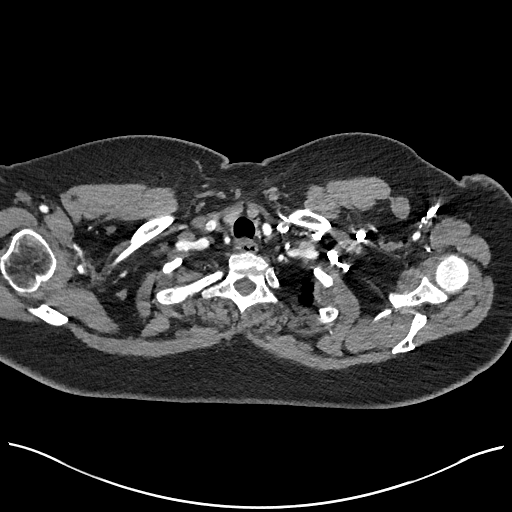
[im 375/398  lung]
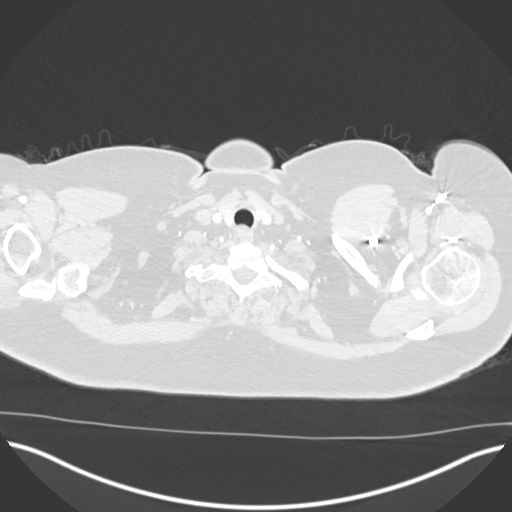

[Series 7: cor soft · coronal · 0.63mm/px · 3 of 171 slices shown]
[im 43/171  soft-tissue]
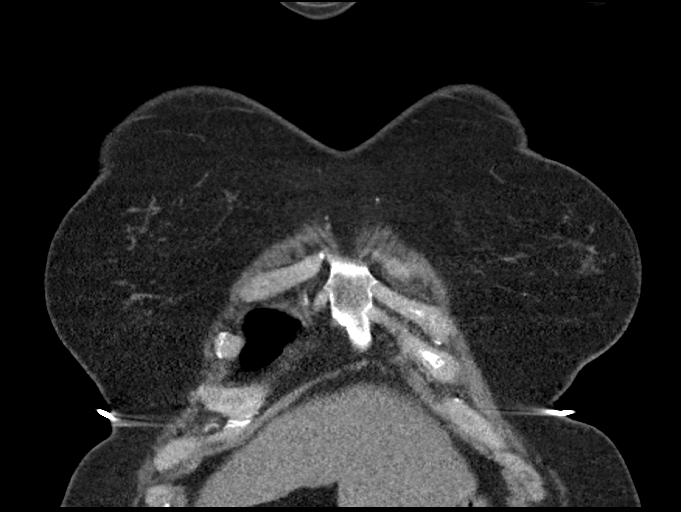
[im 86/171  soft-tissue]
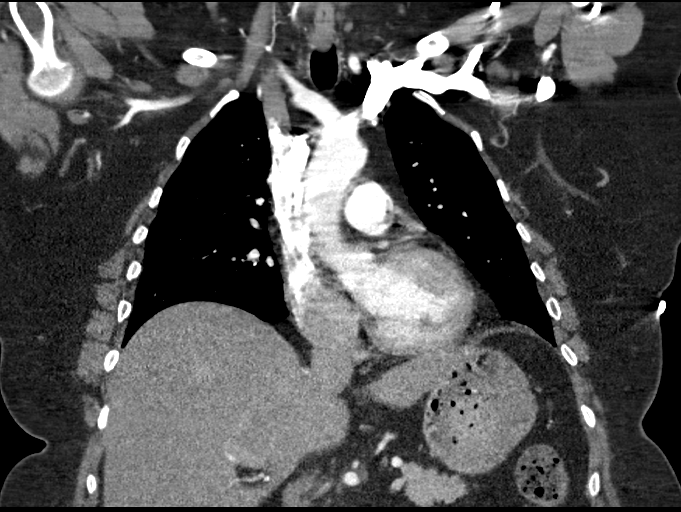
[im 128/171  soft-tissue]
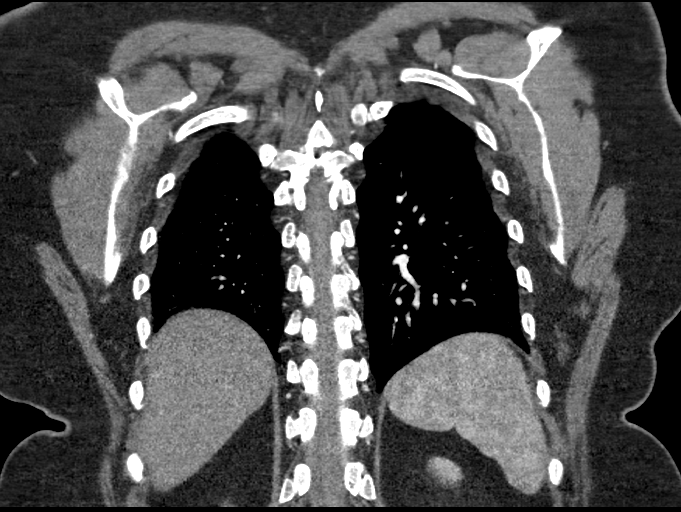

[18 of 46 positions shown; findings below may reference images not displayed]

RADIATION DOSE REDUCTION: This exam was performed according to the
departmental dose-optimization program which includes automated
exposure control, adjustment of the mA and/or kV according to
patient size and/or use of iterative reconstruction technique.

CONTRAST:  75mL OMNIPAQUE IOHEXOL 350 MG/ML SOLN
FINDINGS: Cardiovascular: Satisfactory opacification of the pulmonary arteries
to the segmental level. No evidence of pulmonary embolism. Normal
heart size. No pericardial effusion.

Mediastinum/Nodes: No enlarged mediastinal, hilar, or axillary lymph
nodes. Thyroid gland, trachea, and esophagus demonstrate no
significant findings.

Lungs/Pleura: Lungs are clear. No pleural effusion or pneumothorax.

Upper Abdomen: No acute abnormality.

Musculoskeletal: No chest wall abnormality. No acute or significant
osseous findings.

Review of the MIP images confirms the above findings.
IMPRESSION: No definite evidence of pulmonary embolus. No definite abnormality
seen in the chest.

## 2022-10-23 ENCOUNTER — Other Ambulatory Visit: Payer: Self-pay | Admitting: Family Medicine

## 2022-10-23 DIAGNOSIS — Z1231 Encounter for screening mammogram for malignant neoplasm of breast: Secondary | ICD-10-CM

## 2022-11-13 ENCOUNTER — Ambulatory Visit: Payer: 59 | Admitting: Physician Assistant

## 2022-12-12 ENCOUNTER — Encounter: Payer: Self-pay | Admitting: Radiology

## 2022-12-13 ENCOUNTER — Ambulatory Visit: Payer: 59

## 2022-12-18 ENCOUNTER — Ambulatory Visit
Admission: RE | Admit: 2022-12-18 | Discharge: 2022-12-18 | Disposition: A | Discharge status: Home / Self Care | Payer: 59 | Source: Ambulatory Visit | Attending: Family Medicine | Admitting: Family Medicine

## 2022-12-18 DIAGNOSIS — Z1231 Encounter for screening mammogram for malignant neoplasm of breast: Secondary | ICD-10-CM

## 2023-04-02 ENCOUNTER — Ambulatory Visit: Payer: Self-pay

## 2023-04-07 ENCOUNTER — Ambulatory Visit: Payer: 59 | Admitting: Orthopedic Surgery

## 2023-04-07 ENCOUNTER — Encounter: Payer: Self-pay | Admitting: Orthopedic Surgery

## 2023-04-07 ENCOUNTER — Other Ambulatory Visit (INDEPENDENT_AMBULATORY_CARE_PROVIDER_SITE_OTHER): Payer: 59

## 2023-04-07 VITALS — BP 143/94 | HR 89 | Ht 70.0 in | Wt 275.0 lb

## 2023-04-07 DIAGNOSIS — M545 Low back pain, unspecified: Secondary | ICD-10-CM | POA: Diagnosis not present

## 2023-04-07 DIAGNOSIS — M25572 Pain in left ankle and joints of left foot: Secondary | ICD-10-CM | POA: Diagnosis not present

## 2023-04-07 NOTE — Progress Notes (Signed)
Chief Complaint  Patient presents with   Foot Pain    Left foot pain on the heel x3 months ago popped with pain and radiates around whole foot with sharp pain and gives out    Carry is 52 years old about 3 months ago she stepped off the steps felt a pain near her Achilles tendon and had some discomfort in there for few weeks but that got better she now comes in with pain in the front of her ankle which is intermittent seems to be weightbearing related especially when she is getting out of the car.  She complains of anterior ankle pain  Problem list, medical hx, medications and allergies reviewed   Review of systems she still having bilateral back and leg pain she also has right knee pain status post IM nail by Dr. Constance Goltz about 7 years ago  BP (!) 143/94   Pulse 89   Ht 5\' 10"  (1.778 m)   Wt 275 lb (124.7 kg)   BMI 39.46 kg/m   Physical Exam Vitals and nursing note reviewed.  Constitutional:      Appearance: Normal appearance.  HENT:     Head: Normocephalic and atraumatic.  Eyes:     General: No scleral icterus.       Right eye: No discharge.        Left eye: No discharge.     Extraocular Movements: Extraocular movements intact.     Conjunctiva/sclera: Conjunctivae normal.     Pupils: Pupils are equal, round, and reactive to light.  Cardiovascular:     Rate and Rhythm: Normal rate.     Pulses: Normal pulses.  Musculoskeletal:     Comments: Examination of the left foot and ankle  Skin envelope looks normal.  Pulse and perfusion normal.  Sensation normal.  Range of motion normal.  Drawer test negative.  Tibialis anterior tendon nontender.  Just lateral to this there is tenderness in the ankle joint itself.  Subtalar joint nontender medial side nontender no swelling  Peroneal tendons nontender no swelling    Skin:    General: Skin is warm and dry.     Capillary Refill: Capillary refill takes less than 2 seconds.  Neurological:     General: No focal deficit present.      Mental Status: She is alert and oriented to person, place, and time.     Gait: Gait abnormal.  Psychiatric:        Mood and Affect: Mood normal.        Behavior: Behavior normal.        Thought Content: Thought content normal.        Judgment: Judgment normal.    Images were done in the office  The ankle joint itself looks normal  Encounter Diagnoses  Name Primary?   Pain in left ankle and joints of left foot Yes   Lumbar pain    Unclear etiology of pain in the left ankle  Tibialis anterior tendinitis ruled out by exam Anterior osteophyte and impingement of the ankle ruled out by x-ray  Recommend anti-inflammatories for 4 weeks if no improvement MRI of the ankle  Recommend physical therapy for the back pain

## 2023-04-07 NOTE — Patient Instructions (Signed)
Do the exercises you have of your back and if you want physical therapy just let us know and we can send order to [facility] outpatient therapy for your back

## 2023-12-05 ENCOUNTER — Other Ambulatory Visit: Payer: Self-pay | Admitting: Anesthesiology

## 2023-12-05 DIAGNOSIS — Z1231 Encounter for screening mammogram for malignant neoplasm of breast: Secondary | ICD-10-CM

## 2023-12-24 ENCOUNTER — Ambulatory Visit
Admission: RE | Admit: 2023-12-24 | Discharge: 2023-12-24 | Disposition: A | Discharge status: Home / Self Care | Payer: 59 | Source: Ambulatory Visit | Attending: Anesthesiology | Admitting: Anesthesiology

## 2023-12-24 DIAGNOSIS — Z1231 Encounter for screening mammogram for malignant neoplasm of breast: Secondary | ICD-10-CM

## 2023-12-29 ENCOUNTER — Other Ambulatory Visit (HOSPITAL_COMMUNITY): Payer: Self-pay | Admitting: Family Medicine

## 2023-12-29 DIAGNOSIS — N200 Calculus of kidney: Secondary | ICD-10-CM

## 2023-12-29 DIAGNOSIS — R1032 Left lower quadrant pain: Secondary | ICD-10-CM

## 2023-12-30 ENCOUNTER — Ambulatory Visit (HOSPITAL_COMMUNITY)
Admission: RE | Admit: 2023-12-30 | Discharge: 2023-12-30 | Disposition: A | Discharge status: Home / Self Care | Source: Ambulatory Visit | Attending: Family Medicine | Admitting: Family Medicine

## 2023-12-30 DIAGNOSIS — N200 Calculus of kidney: Secondary | ICD-10-CM | POA: Insufficient documentation

## 2023-12-30 DIAGNOSIS — R1032 Left lower quadrant pain: Secondary | ICD-10-CM | POA: Insufficient documentation

## 2024-02-24 DIAGNOSIS — M545 Low back pain, unspecified: Secondary | ICD-10-CM | POA: Diagnosis not present

## 2024-02-24 DIAGNOSIS — M25552 Pain in left hip: Secondary | ICD-10-CM | POA: Diagnosis not present

## 2024-03-01 DIAGNOSIS — M25552 Pain in left hip: Secondary | ICD-10-CM | POA: Diagnosis not present

## 2024-03-01 DIAGNOSIS — M545 Low back pain, unspecified: Secondary | ICD-10-CM | POA: Diagnosis not present

## 2024-03-03 DIAGNOSIS — Z6838 Body mass index (BMI) 38.0-38.9, adult: Secondary | ICD-10-CM | POA: Diagnosis not present

## 2024-03-03 DIAGNOSIS — R059 Cough, unspecified: Secondary | ICD-10-CM | POA: Diagnosis not present

## 2024-03-03 DIAGNOSIS — J209 Acute bronchitis, unspecified: Secondary | ICD-10-CM | POA: Diagnosis not present

## 2024-03-05 DIAGNOSIS — M25552 Pain in left hip: Secondary | ICD-10-CM | POA: Diagnosis not present

## 2024-03-05 DIAGNOSIS — M545 Low back pain, unspecified: Secondary | ICD-10-CM | POA: Diagnosis not present

## 2024-03-09 DIAGNOSIS — M25552 Pain in left hip: Secondary | ICD-10-CM | POA: Diagnosis not present

## 2024-03-09 DIAGNOSIS — M545 Low back pain, unspecified: Secondary | ICD-10-CM | POA: Diagnosis not present

## 2024-06-24 DIAGNOSIS — R300 Dysuria: Secondary | ICD-10-CM | POA: Diagnosis not present

## 2024-06-24 DIAGNOSIS — R1031 Right lower quadrant pain: Secondary | ICD-10-CM | POA: Diagnosis not present

## 2024-06-24 DIAGNOSIS — Z6838 Body mass index (BMI) 38.0-38.9, adult: Secondary | ICD-10-CM | POA: Diagnosis not present

## 2024-08-10 DIAGNOSIS — K649 Unspecified hemorrhoids: Secondary | ICD-10-CM | POA: Diagnosis not present

## 2024-08-10 DIAGNOSIS — R194 Change in bowel habit: Secondary | ICD-10-CM | POA: Diagnosis not present

## 2024-08-10 DIAGNOSIS — Z1211 Encounter for screening for malignant neoplasm of colon: Secondary | ICD-10-CM | POA: Diagnosis not present

## 2024-08-26 ENCOUNTER — Telehealth: Payer: Self-pay | Admitting: Internal Medicine

## 2024-08-26 NOTE — Telephone Encounter (Signed)
 Ok for an appt It appears she has had a left hemicolectomy and hx of volvulus  JMP

## 2024-08-26 NOTE — Telephone Encounter (Signed)
 Good afternoon Dr. Albertus  The following patient is requesting a transfer of a care since her provider with [facility] has passed away. She had a bowel reconstruction in 2016 and wants to discuss whether she needs a colonoscopy. She was told that she would Never need another but she needs another opinion. Please review and advise of scheduling. Thank you.

## 2024-11-23 ENCOUNTER — Ambulatory Visit: Payer: Self-pay | Admitting: Internal Medicine

## 2025-01-17 ENCOUNTER — Ambulatory Visit: Payer: Self-pay | Admitting: Cardiology
